# Patient Record
Sex: Female | Born: 1975 | Race: White | Hispanic: No | Marital: Single | Smoking: Current every day smoker
Health system: Southern US, Community
[De-identification: ages and names within clinical notes are randomized; demographics above are authoritative.]

## PROBLEM LIST (undated history)

## (undated) DIAGNOSIS — N151 Renal and perinephric abscess: Secondary | ICD-10-CM

## (undated) DIAGNOSIS — N200 Calculus of kidney: Secondary | ICD-10-CM

## (undated) DIAGNOSIS — G43909 Migraine, unspecified, not intractable, without status migrainosus: Secondary | ICD-10-CM

## (undated) HISTORY — PX: TUBAL LIGATION: SHX77

## (undated) HISTORY — PX: RENAL ARTERY STENT: SHX2321

## (undated) HISTORY — PX: APPENDECTOMY: SHX54

---

## 2014-06-17 ENCOUNTER — Emergency Department: Payer: Self-pay | Admitting: Emergency Medicine

## 2014-06-17 LAB — CBC WITH DIFFERENTIAL/PLATELET
BASOS ABS: 0.1 10*3/uL (ref 0.0–0.1)
Basophil %: 1.1 %
EOS ABS: 0.1 10*3/uL (ref 0.0–0.7)
EOS PCT: 0.4 %
HCT: 42.5 % (ref 35.0–47.0)
HGB: 13.6 g/dL (ref 12.0–16.0)
LYMPHS PCT: 13.2 %
Lymphocyte #: 1.7 10*3/uL (ref 1.0–3.6)
MCH: 28.2 pg (ref 26.0–34.0)
MCHC: 32 g/dL (ref 32.0–36.0)
MCV: 88 fL (ref 80–100)
Monocyte #: 0.4 x10 3/mm (ref 0.2–0.9)
Monocyte %: 3.5 %
NEUTROS ABS: 10.5 10*3/uL — AB (ref 1.4–6.5)
Neutrophil %: 81.8 %
Platelet: 359 10*3/uL (ref 150–440)
RBC: 4.84 10*6/uL (ref 3.80–5.20)
RDW: 15.8 % — AB (ref 11.5–14.5)
WBC: 12.8 10*3/uL — ABNORMAL HIGH (ref 3.6–11.0)

## 2014-06-17 LAB — COMPREHENSIVE METABOLIC PANEL
ALK PHOS: 95 U/L
AST: 22 U/L (ref 15–37)
Albumin: 3.8 g/dL (ref 3.4–5.0)
Anion Gap: 8 (ref 7–16)
BUN: 10 mg/dL (ref 7–18)
Bilirubin,Total: 0.4 mg/dL (ref 0.2–1.0)
CALCIUM: 8.9 mg/dL (ref 8.5–10.1)
CO2: 22 mmol/L (ref 21–32)
Chloride: 108 mmol/L — ABNORMAL HIGH (ref 98–107)
Creatinine: 0.82 mg/dL (ref 0.60–1.30)
EGFR (African American): >60
Glucose: 99 mg/dL (ref 65–99)
Osmolality: 275 (ref 275–301)
POTASSIUM: 4.1 mmol/L (ref 3.5–5.1)
SGPT (ALT): 15 U/L
Sodium: 138 mmol/L (ref 136–145)
Total Protein: 8.2 g/dL (ref 6.4–8.2)

## 2014-06-17 LAB — URINALYSIS, COMPLETE
BACTERIA: NONE SEEN
BILIRUBIN, UR: NEGATIVE
Blood: NEGATIVE
GLUCOSE, UR: NEGATIVE mg/dL (ref 0–75)
KETONE: NEGATIVE
Nitrite: NEGATIVE
PROTEIN: NEGATIVE
Ph: 6 (ref 4.5–8.0)
RBC,UR: 6 /HPF (ref 0–5)
SPECIFIC GRAVITY: 1.016 (ref 1.003–1.030)
WBC UR: 35 /HPF (ref 0–5)

## 2014-08-26 ENCOUNTER — Emergency Department: Payer: Self-pay | Admitting: Emergency Medicine

## 2014-08-26 LAB — BASIC METABOLIC PANEL
Anion Gap: 8 (ref 7–16)
BUN: 9 mg/dL (ref 7–18)
CALCIUM: 8.8 mg/dL (ref 8.5–10.1)
CHLORIDE: 103 mmol/L (ref 98–107)
CREATININE: 0.79 mg/dL (ref 0.60–1.30)
Co2: 27 mmol/L (ref 21–32)
GLUCOSE: 100 mg/dL — AB (ref 65–99)
OSMOLALITY: 274 (ref 275–301)
POTASSIUM: 3.7 mmol/L (ref 3.5–5.1)
SODIUM: 138 mmol/L (ref 136–145)

## 2014-08-26 LAB — TROPONIN I

## 2014-08-26 LAB — CBC
HCT: 45.3 % (ref 35.0–47.0)
HGB: 14.7 g/dL (ref 12.0–16.0)
MCH: 27.9 pg (ref 26.0–34.0)
MCHC: 32.4 g/dL (ref 32.0–36.0)
MCV: 86 fL (ref 80–100)
PLATELETS: 365 10*3/uL (ref 150–440)
RBC: 5.25 10*6/uL — AB (ref 3.80–5.20)
RDW: 16.7 % — ABNORMAL HIGH (ref 11.5–14.5)
WBC: 10 10*3/uL (ref 3.6–11.0)

## 2015-04-24 ENCOUNTER — Emergency Department
Admission: EM | Admit: 2015-04-24 | Discharge: 2015-04-24 | Disposition: A | Discharge status: Home / Self Care | Payer: Self-pay | Attending: Emergency Medicine | Admitting: Emergency Medicine

## 2015-04-24 ENCOUNTER — Emergency Department: Payer: Self-pay

## 2015-04-24 DIAGNOSIS — Z720 Tobacco use: Secondary | ICD-10-CM | POA: Insufficient documentation

## 2015-04-24 DIAGNOSIS — F419 Anxiety disorder, unspecified: Secondary | ICD-10-CM | POA: Insufficient documentation

## 2015-04-24 DIAGNOSIS — G43809 Other migraine, not intractable, without status migrainosus: Secondary | ICD-10-CM | POA: Insufficient documentation

## 2015-04-24 HISTORY — DX: Calculus of kidney: N20.0

## 2015-04-24 HISTORY — DX: Migraine, unspecified, not intractable, without status migrainosus: G43.909

## 2015-04-24 MED ORDER — METOCLOPRAMIDE HCL 5 MG/ML IJ SOLN
20.0000 mg | Freq: Once | INTRAVENOUS | Status: AC
  Administered 2015-04-24: 20 mg via INTRAVENOUS
  Filled 2015-04-24: qty 4

## 2015-04-24 MED ORDER — BUTALBITAL-APAP-CAFFEINE 50-325-40 MG PO TABS: 1.0000 | ORAL_TABLET | Freq: Four times a day (QID) | ORAL | Status: DC | PRN

## 2015-04-24 MED ORDER — KETOROLAC TROMETHAMINE 30 MG/ML IJ SOLN
30.0000 mg | Freq: Once | INTRAMUSCULAR | Status: AC
  Administered 2015-04-24: 30 mg via INTRAVENOUS
  Filled 2015-04-24: qty 1

## 2015-04-24 MED ORDER — SODIUM CHLORIDE 0.9 % IV SOLN
1000.0000 mL | Freq: Once | INTRAVENOUS | Status: AC
  Administered 2015-04-24: 1000 mL via INTRAVENOUS

## 2015-04-24 MED ORDER — DIPHENHYDRAMINE HCL 50 MG/ML IJ SOLN
25.0000 mg | Freq: Once | INTRAMUSCULAR | Status: AC
  Administered 2015-04-24: 25 mg via INTRAVENOUS
  Filled 2015-04-24: qty 1

## 2015-04-24 NOTE — ED Notes (Signed)
Presents with headache for couple of days

## 2015-04-24 NOTE — Discharge Instructions (Signed)

## 2015-04-24 NOTE — ED Provider Notes (Signed)
[facility] Emergency Department Provider Note  ____________________________________________  Time seen: On arrival  I have reviewed the triage vital signs and the nursing notes.   HISTORY  Chief Complaint Migraine    HPI Morgan Dean is a [age] y.o. female who presents with a headache which she describes as her typical migraine but somewhat worse. She reports it started 3 days ago she is having nausea and vomiting. She also complains of tightness in her neck but feels that it is related to tension and stress. She has no fevers. No sick contacts. No recent incarceration. She is moving all extremities well. No neuro deficits.     Past Medical History  Diagnosis Date  . Migraine   . Kidney stones     There are no active problems to display for this patient.   Past Surgical History  Procedure Laterality Date  . Tubal ligation    . Appendectomy    . Cesarean section    . Renal artery stent      No current outpatient prescriptions on file.  Allergies Review of patient's allergies indicates no known allergies.  No family history on file.  Social History History  Substance Use Topics  . Smoking status: Current Every Day Smoker -- 0.50 packs/day    Types: Cigarettes  . Smokeless tobacco: Never Used  . Alcohol Use: No    Review of Systems  Constitutional: Negative for fever. Eyes: Negative for visual changes. ENT: Negative for sore throat Cardiovascular: Negative for chest pain. Respiratory: Negative for shortness of breath. Gastrointestinal: Negative for abdominal pain, positive for nausea Genitourinary: Negative for dysuria. Musculoskeletal: Negative for back pain. Skin: Negative for rash. Neurological: Negative for focal weakness Psychiatric: Positive for anxiety  10-point ROS otherwise negative.  ____________________________________________   PHYSICAL EXAM:  VITAL SIGNS: ED Triage Vitals  Enc Vitals Group     BP 04/24/15  1323 127/91 mmHg     Pulse Rate 04/24/15 1323 95     Resp 04/24/15 1323 17     Temp 04/24/15 1323 98.6 F (37 C)     Temp Source 04/24/15 1323 Oral     SpO2 04/24/15 1323 99 %     Weight 04/24/15 1323 140 lb (63.504 kg)     Height 04/24/15 1323 5\' 7"  (1.702 m)     Head Cir --      Peak Flow --      Pain Score 04/24/15 1324 9     Pain Loc --      Pain Edu? --      Excl. in GC? --      Constitutional: Alert and oriented. Well appearing and in no distress. Eyes: Conjunctivae are normal. Normal funduscopic exam ENT   Head: Normocephalic and atraumatic. No meningismus   Mouth/Throat: Mucous membranes are moist. Cardiovascular: Normal rate, regular rhythm. Normal and symmetric distal pulses are present in all extremities. No murmurs, rubs, or gallops. Respiratory: Normal respiratory effort without tachypnea nor retractions. Breath sounds are clear and equal bilaterally.  Gastrointestinal: Soft and non-tender in all quadrants. No distention. There is no CVA tenderness. Genitourinary: deferred Musculoskeletal: Nontender with normal range of motion in all extremities. No lower extremity tenderness nor edema. Neurologic:  Normal speech and language. No gross focal neurologic deficits are appreciated. Normal strength and sensation Skin:  Skin is warm, dry and intact. No rash noted. Psychiatric: Mood and affect are normal. Patient exhibits appropriate insight and judgment.  ____________________________________________    LABS (pertinent positives/negatives)  Labs Reviewed - No data to display  ____________________________________________   EKG  None  ____________________________________________    RADIOLOGY I have personally reviewed any xrays that were ordered on this patient: CT head unremarkable  ____________________________________________   PROCEDURES  Procedure(s) performed: none  Critical Care performed:  none  ____________________________________________   INITIAL IMPRESSION / ASSESSMENT AND PLAN / ED COURSE  Pertinent labs & imaging results that were available during my care of the patient were reviewed by me and considered in my medical decision making (see chart for details).  Patient with symptoms consistent with her migraine syndrome we will do Reglan and Benadryl IV and normal saline. Patient is afebrile, on exam has no meningismus  ----------------------------------------- 2:20 PM on 04/24/2015 -----------------------------------------  Patient reports Reglan and Benadryl don't work for her and is requesting something stronger. I noted that we are using a higher dose of Reglan that she may have had the past which should help ----------------------------------------- 3:55 PM on 04/24/2015 -----------------------------------------  Patient reports significant improvement in headache. She is anxious to go home and rest. We will discharge with prescription for Fioricet. Return precautions discussed ____________________________________________   FINAL CLINICAL IMPRESSION(S) / ED DIAGNOSES  Final diagnoses:  Other migraine without status migrainosus, not intractable     Jene Every, MD 04/24/15 1556

## 2015-04-24 NOTE — ED Notes (Signed)
Pt c/o HA for the past 3 days with N/V, dizziness..states she has a hx of migraines but never this bad or this length of time..states she has been taking OTC meds without relief

## 2015-04-24 NOTE — ED Notes (Addendum)
Worst headache ever , nausea , neck stiffness, photophobia

## 2015-05-12 ENCOUNTER — Encounter: Payer: Self-pay | Admitting: Emergency Medicine

## 2015-05-12 ENCOUNTER — Emergency Department
Admission: EM | Admit: 2015-05-12 | Discharge: 2015-05-12 | Disposition: A | Discharge status: Home / Self Care | Payer: Self-pay | Attending: Emergency Medicine | Admitting: Emergency Medicine

## 2015-05-12 DIAGNOSIS — N390 Urinary tract infection, site not specified: Secondary | ICD-10-CM | POA: Insufficient documentation

## 2015-05-12 DIAGNOSIS — R109 Unspecified abdominal pain: Secondary | ICD-10-CM

## 2015-05-12 DIAGNOSIS — Z3202 Encounter for pregnancy test, result negative: Secondary | ICD-10-CM | POA: Insufficient documentation

## 2015-05-12 DIAGNOSIS — R202 Paresthesia of skin: Secondary | ICD-10-CM | POA: Insufficient documentation

## 2015-05-12 DIAGNOSIS — Z720 Tobacco use: Secondary | ICD-10-CM | POA: Insufficient documentation

## 2015-05-12 DIAGNOSIS — Z79899 Other long term (current) drug therapy: Secondary | ICD-10-CM | POA: Insufficient documentation

## 2015-05-12 LAB — URINALYSIS COMPLETE WITH MICROSCOPIC (ARMC ONLY)
Bilirubin Urine: NEGATIVE
Glucose, UA: NEGATIVE mg/dL
Ketones, ur: NEGATIVE mg/dL
Nitrite: NEGATIVE
PH: 5 (ref 5.0–8.0)
PROTEIN: 100 mg/dL — AB
Specific Gravity, Urine: 1.025 (ref 1.005–1.030)

## 2015-05-12 LAB — POCT PREGNANCY, URINE: Preg Test, Ur: NEGATIVE

## 2015-05-12 MED ORDER — ORPHENADRINE CITRATE 30 MG/ML IJ SOLN
60.0000 mg | INTRAMUSCULAR | Status: AC
  Administered 2015-05-12: 60 mg via INTRAMUSCULAR
  Filled 2015-05-12: qty 2

## 2015-05-12 MED ORDER — KETOROLAC TROMETHAMINE 60 MG/2ML IM SOLN
60.0000 mg | Freq: Once | INTRAMUSCULAR | Status: AC
  Administered 2015-05-12: 60 mg via INTRAMUSCULAR
  Filled 2015-05-12: qty 2

## 2015-05-12 MED ORDER — CEPHALEXIN 500 MG PO CAPS: 500.0000 mg | ORAL_CAPSULE | Freq: Two times a day (BID) | ORAL | Status: AC

## 2015-05-12 MED ORDER — KETOROLAC TROMETHAMINE 10 MG PO TABS: 10.0000 mg | ORAL_TABLET | Freq: Three times a day (TID) | ORAL | Status: DC

## 2015-05-12 MED ORDER — TRAMADOL HCL 50 MG PO TABS: 50.0000 mg | ORAL_TABLET | Freq: Three times a day (TID) | ORAL | Status: DC

## 2015-05-12 MED ORDER — CEPHALEXIN 500 MG PO CAPS
500.0000 mg | ORAL_CAPSULE | Freq: Two times a day (BID) | ORAL | Status: DC
  Administered 2015-05-12: 500 mg via ORAL
  Filled 2015-05-12: qty 1

## 2015-05-12 MED ORDER — HYDROCODONE-ACETAMINOPHEN 5-325 MG PO TABS: 1.0000 | ORAL_TABLET | Freq: Four times a day (QID) | ORAL | Status: DC | PRN

## 2015-05-12 NOTE — Discharge Instructions (Signed)
Urinary Tract Infection Urinary tract infections (UTIs) can develop anywhere along your urinary tract. Your urinary tract is your body's drainage system for removing wastes and extra water. Your urinary tract includes two kidneys, two ureters, a bladder, and a urethra. Your kidneys are a pair of bean-shaped organs. Each kidney is about the size of your fist. They are located below your ribs, one on each side of your spine. CAUSES Infections are caused by microbes, which are microscopic organisms, including fungi, viruses, and bacteria. These organisms are so small that they can only be seen through a microscope. Bacteria are the microbes that most commonly cause UTIs. SYMPTOMS  Symptoms of UTIs may vary by age and gender of the patient and by the location of the infection. Symptoms in young women typically include a frequent and intense urge to urinate and a painful, burning feeling in the bladder or urethra during urination. Older women and men are more likely to be tired, shaky, and weak and have muscle aches and abdominal pain. A fever may mean the infection is in your kidneys. Other symptoms of a kidney infection include pain in your back or sides below the ribs, nausea, and vomiting. DIAGNOSIS To diagnose a UTI, your caregiver will ask you about your symptoms. Your caregiver also will ask to provide a urine sample. The urine sample will be tested for bacteria and white blood cells. White blood cells are made by your body to help fight infection. TREATMENT  Typically, UTIs can be treated with medication. Because most UTIs are caused by a bacterial infection, they usually can be treated with the use of antibiotics. The choice of antibiotic and length of treatment depend on your symptoms and the type of bacteria causing your infection. HOME CARE INSTRUCTIONS  If you were prescribed antibiotics, take them exactly as your caregiver instructs you. Finish the medication even if you feel better after you  have only taken some of the medication.  Drink enough water and fluids to keep your urine clear or pale yellow.  Avoid caffeine, tea, and carbonated beverages. They tend to irritate your bladder.  Empty your bladder often. Avoid holding urine for long periods of time.  Empty your bladder before and after sexual intercourse.  After a bowel movement, women should cleanse from front to back. Use each tissue only once. SEEK MEDICAL CARE IF:   You have back pain.  You develop a fever.  Your symptoms do not begin to resolve within 3 days. SEEK IMMEDIATE MEDICAL CARE IF:   You have severe back pain or lower abdominal pain.  You develop chills.  You have nausea or vomiting.  You have continued burning or discomfort with urination. MAKE SURE YOU:   Understand these instructions.  Will watch your condition.  Will get help right away if you are not doing well or get worse. Document Released: 07/03/2005 Document Revised: 03/24/2012 Document Reviewed: 11/01/2011 [facility] Patient Information 2015 [place], [place]. This information is not intended to replace advice given to you by your health care provider. Make sure you discuss any questions you have with your health care provider.  Take the antibiotic as directed until completely gone.  Take the pain medicine and anti-inflammatory as directed.  Increase fluid intake and take regular bathroom breaks.  Follow-up with [employer] or the [facility] Department for further care. Return as needed.

## 2015-05-12 NOTE — ED Notes (Signed)
C/o mid to low back and pain right low back also painful urination, also some tingling in legs

## 2015-05-12 NOTE — ED Notes (Signed)
Pt presents with low back pain for four days. Pt with hx of UTI.

## 2015-05-12 NOTE — ED Provider Notes (Signed)
[facility] Emergency Department Provider Note ____________________________________________  Time seen: 1120  I have reviewed the triage vital signs and the nursing notes.  HISTORY  Chief Complaint  Back Pain  HPI Morgan Dean is a 39 y.o. female reports to the ED with complaints of low back pain for the last 4 days. She also notes some dysuria over the last couple days. She has a history of recurrent UTIs. She notes that this morning she had some tingling to the anterior thighs, but denies any known trauma, injury, slip, trip, fall. She denies any weakness in the lower extremities or any incontinence of bladder or bowel. She has dosed Tylenol and Motrin without significant relief to her symptoms. She gives a history of kidney stones denies any gross hematuria or any visible stones with urination. She also denies any nausea, vomiting, fever, chills, or sweats.  Past Medical History  Diagnosis Date  . Migraine   . Kidney stones    There are no active problems to display for this patient.  Past Surgical History  Procedure Laterality Date  . Tubal ligation    . Appendectomy    . Cesarean section    . Renal artery stent      Current Outpatient Rx  Name  Route  Sig  Dispense  Refill  . butalbital-acetaminophen-caffeine (FIORICET) 50-325-40 MG per tablet   Oral   Take 1-2 tablets by mouth every 6 (six) hours as needed for headache.   20 tablet   0   . cephALEXin (KEFLEX) 500 MG capsule   Oral   Take 1 capsule (500 mg total) by mouth 2 (two) times daily.   14 capsule   0   . ketorolac (TORADOL) 10 MG tablet   Oral   Take 1 tablet (10 mg total) by mouth every 8 (eight) hours.   15 tablet   0   . traMADol (ULTRAM) 50 MG tablet   Oral   Take 1 tablet (50 mg total) by mouth 3 (three) times daily.   12 tablet   0    Allergies Review of patient's allergies indicates no known allergies.  No family history on file.  Social History History   Substance Use Topics  . Smoking status: Current Every Day Smoker -- 0.50 packs/day    Types: Cigarettes  . Smokeless tobacco: Never Used  . Alcohol Use: No   Review of Systems  Constitutional: Negative for fever. Eyes: Negative for visual changes. ENT: Negative for sore throat. Cardiovascular: Negative for chest pain. Respiratory: Negative for shortness of breath. Gastrointestinal: Negative for abdominal pain, vomiting and diarrhea. Genitourinary: Positive for dysuria. Musculoskeletal: Positive for back pain. Skin: Negative for rash. Neurological: Negative for headaches, focal weakness or numbness. ____________________________________________  PHYSICAL EXAM:  VITAL SIGNS: ED Triage Vitals  Enc Vitals Group     BP 05/12/15 1057 133/93 mmHg     Pulse Rate 05/12/15 1057 98     Resp 05/12/15 1057 20     Temp 05/12/15 1057 98 F (36.7 C)     Temp Source 05/12/15 1057 Oral     SpO2 05/12/15 1057 100 %     Weight 05/12/15 1057 140 lb (63.504 kg)     Height 05/12/15 1057 5\' 7"  (1.702 m)     Head Cir --      Peak Flow --      Pain Score 05/12/15 1057 10     Pain Loc --      Pain Edu? --  Excl. in GC? --    Constitutional: Alert and oriented. Well appearing and in no distress. Eyes: Conjunctivae are normal. PERRL. Normal extraocular movements. ENT   Head: Normocephalic and atraumatic.   Nose: No congestion/rhinnorhea.   Mouth/Throat: Mucous membranes are moist.   Neck: Supple. No thyromegaly. Hematological/Lymphatic/Immunilogical: No cervical lymphadenopathy. Cardiovascular: Normal rate, regular rhythm.  Respiratory: Normal respiratory effort. No wheezes/rales/rhonchi. Gastrointestinal: Soft and nontender. No distention. Right CVA tenderness Musculoskeletal: Nontender with normal range of motion in all extremities. Normal spinal alignment without spasm, step-off or deformity. Normal lumbar flexion to the toes. Normal toe-heel raise.  Neurologic:  Normal  gait without ataxia. Normal speech and language. No gross focal neurologic deficits are appreciated. Normal LE DTRs bilaterally. Negative SLR bilaterally.  Skin:  Skin is warm, dry and intact. No rash noted. Psychiatric: Mood and affect are normal. Patient exhibits appropriate insight and judgment. ____________________________________________    LABS (pertinent positives/negatives) Labs Reviewed  URINALYSIS COMPLETEWITH MICROSCOPIC ([facility] ONLY) - Abnormal; Notable for the following:    Color, Urine YELLOW (*)    APPearance CLOUDY (*)    Hgb urine dipstick 3+ (*)    Protein, ur 100 (*)    Leukocytes, UA 3+ (*)    Bacteria, UA FEW (*)    Squamous Epithelial / LPF 0-5 (*)    All other components within normal limits  URINE CULTURE  POC URINE PREG, ED  POCT PREGNANCY, URINE  ____________________________________________  PROCEDURES  Toradol 60 mg IM Norflex 60 mg IM Keflex 500 mg PO ____________________________________________  INITIAL IMPRESSION / ASSESSMENT AND PLAN / ED COURSE  Treatment for acute UTI and right flank pain. Prescription Keflex #14, Ultram #12, and Toradol #15 provided.  Follow-up with [employer] as needed. Return as discussed for increased flank pain, hematuria, or dysuria.   ----------------------------------------- 1:16 PM on 05/12/2015 ----------------------------------------- Patient reports to RN at discharge that Ultram caused vomiting.  Will prescribe Norco #10 as alternate.  ____________________________________________  FINAL CLINICAL IMPRESSION(S) / ED DIAGNOSES  Final diagnoses:  UTI (lower urinary tract infection)  Acute flank pain     Lissa Hoard, PA-C 05/12/15 [address] [place], [place] 05/12/15 1317

## 2015-05-14 LAB — URINE CULTURE: SPECIAL REQUESTS: NORMAL

## 2015-05-17 ENCOUNTER — Emergency Department: Payer: BLUE CROSS/BLUE SHIELD

## 2015-05-17 ENCOUNTER — Encounter: Payer: Self-pay | Admitting: *Deleted

## 2015-05-17 ENCOUNTER — Inpatient Hospital Stay: Payer: BLUE CROSS/BLUE SHIELD | Admitting: Anesthesiology

## 2015-05-17 ENCOUNTER — Encounter
Admission: EM | Disposition: A | Discharge status: Home / Self Care | Payer: Self-pay | Source: Home / Self Care | Attending: Internal Medicine

## 2015-05-17 ENCOUNTER — Inpatient Hospital Stay
Admission: EM | Admit: 2015-05-17 | Discharge: 2015-05-20 | DRG: 853 | Disposition: A | Discharge status: Home / Self Care | Payer: BLUE CROSS/BLUE SHIELD | Attending: Internal Medicine | Admitting: Internal Medicine

## 2015-05-17 DIAGNOSIS — Z888 Allergy status to other drugs, medicaments and biological substances status: Secondary | ICD-10-CM | POA: Diagnosis not present

## 2015-05-17 DIAGNOSIS — F419 Anxiety disorder, unspecified: Secondary | ICD-10-CM | POA: Diagnosis present

## 2015-05-17 DIAGNOSIS — Z9851 Tubal ligation status: Secondary | ICD-10-CM | POA: Diagnosis not present

## 2015-05-17 DIAGNOSIS — Z9049 Acquired absence of other specified parts of digestive tract: Secondary | ICD-10-CM | POA: Diagnosis present

## 2015-05-17 DIAGNOSIS — A4151 Sepsis due to Escherichia coli [E. coli]: Principal | ICD-10-CM

## 2015-05-17 DIAGNOSIS — Z87442 Personal history of urinary calculi: Secondary | ICD-10-CM | POA: Diagnosis not present

## 2015-05-17 DIAGNOSIS — E876 Hypokalemia: Secondary | ICD-10-CM | POA: Diagnosis present

## 2015-05-17 DIAGNOSIS — Z79891 Long term (current) use of opiate analgesic: Secondary | ICD-10-CM | POA: Diagnosis not present

## 2015-05-17 DIAGNOSIS — Z79899 Other long term (current) drug therapy: Secondary | ICD-10-CM

## 2015-05-17 DIAGNOSIS — F17210 Nicotine dependence, cigarettes, uncomplicated: Secondary | ICD-10-CM | POA: Diagnosis present

## 2015-05-17 DIAGNOSIS — N390 Urinary tract infection, site not specified: Secondary | ICD-10-CM

## 2015-05-17 DIAGNOSIS — N151 Renal and perinephric abscess: Secondary | ICD-10-CM | POA: Diagnosis present

## 2015-05-17 DIAGNOSIS — R509 Fever, unspecified: Secondary | ICD-10-CM

## 2015-05-17 DIAGNOSIS — N134 Hydroureter: Secondary | ICD-10-CM

## 2015-05-17 DIAGNOSIS — N201 Calculus of ureter: Secondary | ICD-10-CM | POA: Diagnosis present

## 2015-05-17 DIAGNOSIS — N132 Hydronephrosis with renal and ureteral calculous obstruction: Secondary | ICD-10-CM | POA: Diagnosis present

## 2015-05-17 DIAGNOSIS — L0291 Cutaneous abscess, unspecified: Secondary | ICD-10-CM

## 2015-05-17 DIAGNOSIS — N1330 Unspecified hydronephrosis: Secondary | ICD-10-CM

## 2015-05-17 DIAGNOSIS — Z9889 Other specified postprocedural states: Secondary | ICD-10-CM

## 2015-05-17 HISTORY — PX: CYSTOSCOPY WITH RETROGRADE PYELOGRAM, URETEROSCOPY AND STENT PLACEMENT: SHX5789

## 2015-05-17 HISTORY — PX: URETEROSCOPY WITH HOLMIUM LASER LITHOTRIPSY: SHX6645

## 2015-05-17 LAB — CBC WITH DIFFERENTIAL/PLATELET
BASOS PCT: 1 %
Basophils Absolute: 0.1 10*3/uL (ref 0–0.1)
EOS PCT: 1 %
Eosinophils Absolute: 0.1 10*3/uL (ref 0–0.7)
HEMATOCRIT: 32.4 % — AB (ref 35.0–47.0)
Hemoglobin: 10.7 g/dL — ABNORMAL LOW (ref 12.0–16.0)
Lymphocytes Relative: 14 %
Lymphs Abs: 1.7 10*3/uL (ref 1.0–3.6)
MCH: 26.9 pg (ref 26.0–34.0)
MCHC: 33 g/dL (ref 32.0–36.0)
MCV: 81.6 fL (ref 80.0–100.0)
MONO ABS: 1 10*3/uL — AB (ref 0.2–0.9)
MONOS PCT: 8 %
NEUTROS PCT: 76 %
Neutro Abs: 9.4 10*3/uL — ABNORMAL HIGH (ref 1.4–6.5)
Platelets: 318 10*3/uL (ref 150–440)
RBC: 3.97 MIL/uL (ref 3.80–5.20)
RDW: 14.5 % (ref 11.5–14.5)
WBC: 12.2 10*3/uL — ABNORMAL HIGH (ref 3.6–11.0)

## 2015-05-17 LAB — COMPREHENSIVE METABOLIC PANEL
ALK PHOS: 115 U/L (ref 38–126)
ALT: 20 U/L (ref 14–54)
ANION GAP: 10 (ref 5–15)
AST: 15 U/L (ref 15–41)
Albumin: 3.6 g/dL (ref 3.5–5.0)
BILIRUBIN TOTAL: 0.2 mg/dL — AB (ref 0.3–1.2)
BUN: 9 mg/dL (ref 6–20)
CHLORIDE: 96 mmol/L — AB (ref 101–111)
CO2: 27 mmol/L (ref 22–32)
Calcium: 8.8 mg/dL — ABNORMAL LOW (ref 8.9–10.3)
Creatinine, Ser: 0.78 mg/dL (ref 0.44–1.00)
GFR calc Af Amer: >60 mL/min (ref >60)
GLUCOSE: 116 mg/dL — AB (ref 65–99)
Potassium: 3.3 mmol/L — ABNORMAL LOW (ref 3.5–5.1)
Sodium: 133 mmol/L — ABNORMAL LOW (ref 135–145)
TOTAL PROTEIN: 7.8 g/dL (ref 6.5–8.1)

## 2015-05-17 LAB — URINALYSIS COMPLETE WITH MICROSCOPIC (ARMC ONLY)
Bacteria, UA: NONE SEEN
Bilirubin Urine: NEGATIVE
Glucose, UA: NEGATIVE mg/dL
KETONES UR: NEGATIVE mg/dL
NITRITE: NEGATIVE
PROTEIN: 30 mg/dL — AB
SQUAMOUS EPITHELIAL / LPF: NONE SEEN
Specific Gravity, Urine: 1.023 (ref 1.005–1.030)
pH: 5 (ref 5.0–8.0)

## 2015-05-17 LAB — CREATININE, SERUM
Creatinine, Ser: 0.67 mg/dL (ref 0.44–1.00)
GFR calc Af Amer: >60 mL/min (ref >60)
GFR calc non Af Amer: >60 mL/min (ref >60)

## 2015-05-17 LAB — CBC
HCT: 30.8 % — ABNORMAL LOW (ref 35.0–47.0)
Hemoglobin: 10.2 g/dL — ABNORMAL LOW (ref 12.0–16.0)
MCH: 27.1 pg (ref 26.0–34.0)
MCHC: 33.2 g/dL (ref 32.0–36.0)
MCV: 81.6 fL (ref 80.0–100.0)
Platelets: 292 10*3/uL (ref 150–440)
RBC: 3.78 MIL/uL — ABNORMAL LOW (ref 3.80–5.20)
RDW: 14.4 % (ref 11.5–14.5)
WBC: 10.5 10*3/uL (ref 3.6–11.0)

## 2015-05-17 LAB — LIPASE, BLOOD: Lipase: 13 U/L — ABNORMAL LOW (ref 22–51)

## 2015-05-17 LAB — LACTIC ACID, PLASMA
Lactic Acid, Venous: 1.4 mmol/L (ref 0.5–2.0)
Lactic Acid, Venous: 1.3 mmol/L (ref 0.5–2.0)

## 2015-05-17 LAB — POCT PREGNANCY, URINE: PREG TEST UR: NEGATIVE

## 2015-05-17 SURGERY — CYSTOURETEROSCOPY, WITH RETROGRADE PYELOGRAM AND STENT INSERTION
Anesthesia: General | Site: Ureter | Laterality: Right | Wound class: Clean Contaminated

## 2015-05-17 MED ORDER — MIDAZOLAM HCL 2 MG/2ML IJ SOLN
INTRAMUSCULAR | Status: DC | PRN
  Administered 2015-05-17: 1 mg via INTRAVENOUS

## 2015-05-17 MED ORDER — ONDANSETRON HCL 4 MG/2ML IJ SOLN
4.0000 mg | INTRAMUSCULAR | Status: AC
  Administered 2015-05-17: 4 mg via INTRAVENOUS
  Filled 2015-05-17: qty 2

## 2015-05-17 MED ORDER — SODIUM CHLORIDE 0.9 % IJ SOLN
3.0000 mL | Freq: Two times a day (BID) | INTRAMUSCULAR | Status: DC
  Administered 2015-05-17 – 2015-05-18 (×2): 3 mL via INTRAVENOUS

## 2015-05-17 MED ORDER — FENTANYL CITRATE (PF) 100 MCG/2ML IJ SOLN
INTRAMUSCULAR | Status: DC | PRN
  Administered 2015-05-17: 50 ug via INTRAVENOUS

## 2015-05-17 MED ORDER — IOHEXOL 240 MG/ML SOLN
25.0000 mL | Freq: Once | INTRAMUSCULAR | Status: AC | PRN
  Administered 2015-05-17: 25 mL via ORAL

## 2015-05-17 MED ORDER — CIPROFLOXACIN IN D5W 400 MG/200ML IV SOLN
400.0000 mg | INTRAVENOUS | Status: AC
  Administered 2015-05-17: 400 mg via INTRAVENOUS
  Filled 2015-05-17: qty 200

## 2015-05-17 MED ORDER — DEXTROSE 5 % IV SOLN
1.0000 g | INTRAVENOUS | Status: AC
  Administered 2015-05-17: 1 g via INTRAVENOUS

## 2015-05-17 MED ORDER — PHENYLEPHRINE HCL 10 MG/ML IJ SOLN
INTRAMUSCULAR | Status: DC | PRN
Start: 2015-05-17 — End: 2015-05-17
  Administered 2015-05-17 (×10): 100 ug via INTRAVENOUS

## 2015-05-17 MED ORDER — CEFTRIAXONE SODIUM 1 G IJ SOLR
INTRAMUSCULAR | Status: AC
  Filled 2015-05-17: qty 10

## 2015-05-17 MED ORDER — HYDROMORPHONE HCL 1 MG/ML IJ SOLN
INTRAMUSCULAR | Status: AC
Start: 2015-05-17 — End: 2015-05-17
  Administered 2015-05-17: 1 mg via INTRAVENOUS
  Filled 2015-05-17: qty 1

## 2015-05-17 MED ORDER — SODIUM CHLORIDE 0.9 % IV BOLUS (SEPSIS)
1000.0000 mL | INTRAVENOUS | Status: AC
  Administered 2015-05-17: 1000 mL via INTRAVENOUS

## 2015-05-17 MED ORDER — DEXTROSE 5 % IV SOLN
1.0000 g | INTRAVENOUS | Status: DC
  Filled 2015-05-17: qty 10

## 2015-05-17 MED ORDER — ACETAMINOPHEN 325 MG PO TABS
650.0000 mg | ORAL_TABLET | Freq: Four times a day (QID) | ORAL | Status: DC | PRN
Start: 2015-05-17 — End: 2015-05-20
  Administered 2015-05-18: 650 mg via ORAL
  Filled 2015-05-17: qty 2

## 2015-05-17 MED ORDER — SODIUM CHLORIDE 0.9 % IV SOLN
INTRAVENOUS | Status: DC
  Administered 2015-05-17 – 2015-05-20 (×7): via INTRAVENOUS

## 2015-05-17 MED ORDER — NICOTINE 21 MG/24HR TD PT24
21.0000 mg | MEDICATED_PATCH | Freq: Every day | TRANSDERMAL | Status: DC
  Administered 2015-05-17 – 2015-05-20 (×4): 21 mg via TRANSDERMAL
  Filled 2015-05-17 (×4): qty 1

## 2015-05-17 MED ORDER — HYDROMORPHONE HCL 1 MG/ML IJ SOLN
1.0000 mg | INTRAMUSCULAR | Status: AC
  Administered 2015-05-17: 1 mg via INTRAVENOUS

## 2015-05-17 MED ORDER — POTASSIUM CHLORIDE 20 MEQ/15ML (10%) PO SOLN
40.0000 meq | Freq: Once | ORAL | Status: AC
  Administered 2015-05-18: 40 meq via ORAL
  Filled 2015-05-17 (×2): qty 30

## 2015-05-17 MED ORDER — POTASSIUM CHLORIDE CRYS ER 20 MEQ PO TBCR
EXTENDED_RELEASE_TABLET | ORAL | Status: AC
  Filled 2015-05-17: qty 2

## 2015-05-17 MED ORDER — SENNOSIDES-DOCUSATE SODIUM 8.6-50 MG PO TABS: 1.0000 | ORAL_TABLET | Freq: Every evening | ORAL | Status: DC | PRN

## 2015-05-17 MED ORDER — BISACODYL 5 MG PO TBEC: 5.0000 mg | DELAYED_RELEASE_TABLET | Freq: Every day | ORAL | Status: DC | PRN

## 2015-05-17 MED ORDER — ALUM & MAG HYDROXIDE-SIMETH 200-200-20 MG/5ML PO SUSP: 30.0000 mL | Freq: Four times a day (QID) | ORAL | Status: DC | PRN

## 2015-05-17 MED ORDER — ACETAMINOPHEN 650 MG RE SUPP: 650.0000 mg | Freq: Four times a day (QID) | RECTAL | Status: DC | PRN

## 2015-05-17 MED ORDER — CIPROFLOXACIN IN D5W 400 MG/200ML IV SOLN
400.0000 mg | Freq: Once | INTRAVENOUS | Status: DC
  Filled 2015-05-17: qty 200

## 2015-05-17 MED ORDER — CEFTRIAXONE SODIUM 1 G IJ SOLR
1.0000 g | INTRAMUSCULAR | Status: AC
  Administered 2015-05-17: 1 g via INTRAVENOUS
  Filled 2015-05-17 (×2): qty 10

## 2015-05-17 MED ORDER — IOHEXOL 300 MG/ML  SOLN
100.0000 mL | Freq: Once | INTRAMUSCULAR | Status: AC | PRN
  Administered 2015-05-17: 100 mL via INTRAVENOUS

## 2015-05-17 MED ORDER — ONDANSETRON HCL 4 MG/2ML IJ SOLN: 4.0000 mg | Freq: Four times a day (QID) | INTRAMUSCULAR | Status: DC | PRN

## 2015-05-17 MED ORDER — FENTANYL CITRATE (PF) 100 MCG/2ML IJ SOLN
25.0000 ug | INTRAMUSCULAR | Status: AC | PRN
  Administered 2015-05-17 (×6): 25 ug via INTRAVENOUS

## 2015-05-17 MED ORDER — MORPHINE SULFATE 4 MG/ML IJ SOLN
4.0000 mg | Freq: Once | INTRAMUSCULAR | Status: AC
  Administered 2015-05-17: 4 mg via INTRAVENOUS
  Filled 2015-05-17: qty 1

## 2015-05-17 MED ORDER — LIDOCAINE HCL (CARDIAC) 20 MG/ML IV SOLN
INTRAVENOUS | Status: DC | PRN
  Administered 2015-05-17: 80 mg via INTRAVENOUS

## 2015-05-17 MED ORDER — PROPOFOL 10 MG/ML IV BOLUS
INTRAVENOUS | Status: DC | PRN
  Administered 2015-05-17: 150 mg via INTRAVENOUS

## 2015-05-17 MED ORDER — PROMETHAZINE HCL 25 MG/ML IJ SOLN: 6.2500 mg | INTRAMUSCULAR | Status: DC | PRN

## 2015-05-17 MED ORDER — HYDROMORPHONE HCL 1 MG/ML IJ SOLN
1.0000 mg | INTRAMUSCULAR | Status: DC | PRN
  Administered 2015-05-18 – 2015-05-19 (×12): 1 mg via INTRAVENOUS
  Filled 2015-05-17 (×12): qty 1

## 2015-05-17 MED ORDER — HEPARIN SODIUM (PORCINE) 5000 UNIT/ML IJ SOLN
5000.0000 [IU] | Freq: Three times a day (TID) | INTRAMUSCULAR | Status: DC
  Administered 2015-05-17 – 2015-05-20 (×7): 5000 [IU] via SUBCUTANEOUS
  Filled 2015-05-17 (×7): qty 1

## 2015-05-17 MED ORDER — HYDROCODONE-ACETAMINOPHEN 5-325 MG PO TABS
1.0000 | ORAL_TABLET | ORAL | Status: DC | PRN
  Administered 2015-05-17: 2 via ORAL
  Administered 2015-05-18: 1 via ORAL
  Administered 2015-05-18 – 2015-05-19 (×2): 2 via ORAL
  Filled 2015-05-17: qty 2
  Filled 2015-05-17: qty 1
  Filled 2015-05-17 (×2): qty 2

## 2015-05-17 MED ORDER — SODIUM CHLORIDE 0.9 % IR SOLN
Status: DC | PRN
  Administered 2015-05-17: 3000 mL

## 2015-05-17 MED ORDER — SUCCINYLCHOLINE CHLORIDE 20 MG/ML IJ SOLN
INTRAMUSCULAR | Status: DC | PRN
  Administered 2015-05-17: 100 mg via INTRAVENOUS

## 2015-05-17 MED ORDER — ONDANSETRON HCL 4 MG PO TABS: 4.0000 mg | ORAL_TABLET | Freq: Four times a day (QID) | ORAL | Status: DC | PRN

## 2015-05-17 MED ORDER — DEXTROSE 5 % IV SOLN
INTRAVENOUS | Status: AC
  Administered 2015-05-17: 1 g via INTRAVENOUS
  Filled 2015-05-17: qty 10

## 2015-05-17 MED ORDER — SODIUM CHLORIDE 0.9 % IV SOLN
INTRAVENOUS | Status: DC | PRN
  Administered 2015-05-17: 9 mL

## 2015-05-17 MED ORDER — LACTATED RINGERS IV SOLN
INTRAVENOUS | Status: DC | PRN
  Administered 2015-05-17: 22:00:00 via INTRAVENOUS

## 2015-05-17 SURGICAL SUPPLY — 36 items
ADAPTER CATH URET PLST 4-6FR (CATHETERS) IMPLANT
BAG DRAIN CYSTO-URO LG1000N (MISCELLANEOUS) ×4 IMPLANT
BASKET LASER NITINOL 1.9FR (BASKET) ×4 IMPLANT
BASKET STNLS GEMINI 4WIRE 3FR (BASKET) IMPLANT
BASKET ZERO TIP NITINOL 2.4FR (BASKET) IMPLANT
CATH FOLEY 2WAY  5CC 16FR (CATHETERS) ×2
CATH INTERMIT  6FR 70CM (CATHETERS) IMPLANT
CATH URET 5FR 28IN CONE TIP (BALLOONS)
CATH URET 5FR 28IN OPEN ENDED (CATHETERS) IMPLANT
CATH URET 5FR 70CM CONE TIP (BALLOONS) IMPLANT
CATH URET DUAL LUMEN 6-10FR 50 (CATHETERS) IMPLANT
CATH URTH 16FR FL 2W BLN LF (CATHETERS) ×2 IMPLANT
ELECT REM PT RETURN 9FT ADLT (ELECTROSURGICAL)
ELECTRODE REM PT RTRN 9FT ADLT (ELECTROSURGICAL) IMPLANT
FIBER LASER FLEXIVA 365 (UROLOGICAL SUPPLIES) IMPLANT
FIBER LASER TRAC TIP (UROLOGICAL SUPPLIES) IMPLANT
GLOVE BIO SURGEON STRL SZ7.5 (GLOVE) ×4 IMPLANT
GOWN STRL REUS W/ TWL LRG LVL3 (GOWN DISPOSABLE) ×2 IMPLANT
GOWN STRL REUS W/TWL LRG LVL3 (GOWN DISPOSABLE) ×2
GUIDEWIRE 0.038 PTFE COATED (WIRE) ×4 IMPLANT
GUIDEWIRE ANG ZIPWIRE 038X150 (WIRE) IMPLANT
GUIDEWIRE STR DUAL SENSOR (WIRE) IMPLANT
IV NS IRRIG 3000ML ARTHROMATIC (IV SOLUTION) ×8 IMPLANT
KIT BALLIN UROMAX 15FX10 (LABEL) IMPLANT
KIT BALLN UROMAX 15FX4 (MISCELLANEOUS) IMPLANT
KIT BALLN UROMAX 26 75X4 (MISCELLANEOUS)
LASER HOLMIUM SU 200UM (MISCELLANEOUS) ×4 IMPLANT
LASER HOLMIUM SU 940UM (MISCELLANEOUS) ×4 IMPLANT
PACK CYSTO (CUSTOM PROCEDURE TRAY) ×4 IMPLANT
PAD OB MATERNITY 4.3X12.25 (PERSONAL CARE ITEMS) ×4 IMPLANT
SENSORWIRE 0.038 NOT ANGLED (WIRE) ×4
SET HIGH PRES BAL DIL (LABEL)
SHEATH ACCESS URETERAL 38CM (SHEATH) IMPLANT
STENT URET 6FRX26 CONTOUR (STENTS) ×4 IMPLANT
SYRINGE IRR TOOMEY STRL 70CC (SYRINGE) IMPLANT
WIRE SENSOR 0.038 NOT ANGLED (WIRE) ×2 IMPLANT

## 2015-05-17 NOTE — Consult Note (Signed)
Consult: Right ureteral stone, right hydronephrosis, sepsis, right renal abscess Requested by: Dr. York Cerise; Dr. Juliene Pina  History of Present Illness: Patient is a 39 year old female who presented with urinary tract infection a few days ago. Urine culture grew Escherichia coli. She was started on cephalexin but sensitivities were intermediate for cefazolin. Therefore she was transitioned to Cipro but she developed right flank pain nausea and vomiting. She returned today to the emergency department and underwent CT scan. CT revealed a right lower pole renal abscess, right hydroureteronephrosis down to a 5 mm right ureterovesical junction stone. Her white count was 12.2. She is tachycardic. She was given Rocephin in the ED. She's had fevers and chills and a temperature 101. UA showed too numerous to count white cells but no bacteria. She continues to have right flank pain.  She does have a history of kidney stones and had a stent for a kidney stone about 14 years ago.  Past Medical History  Diagnosis Date  . Migraine   . Kidney stones    Past Surgical History  Procedure Laterality Date  . Tubal ligation    . Appendectomy    . Cesarean section    . Renal artery stent      Home Medications:  Prescriptions prior to admission  Medication Sig Dispense Refill Last Dose  . ciprofloxacin (CIPRO) 500 MG tablet Take 500 mg by mouth 2 (two) times daily.   05/17/2015 at Unknown time  . butalbital-acetaminophen-caffeine (FIORICET) 50-325-40 MG per tablet Take 1-2 tablets by mouth every 6 (six) hours as needed for headache. (Patient not taking: Reported on 05/17/2015) 20 tablet 0   . cephALEXin (KEFLEX) 500 MG capsule Take 1 capsule (500 mg total) by mouth 2 (two) times daily. (Patient not taking: Reported on 05/17/2015) 14 capsule 0   . HYDROcodone-acetaminophen (NORCO) 5-325 MG per tablet Take 1 tablet by mouth every 6 (six) hours as needed for moderate pain. (Patient not taking: Reported on 05/17/2015) 10 tablet  0   . ketorolac (TORADOL) 10 MG tablet Take 1 tablet (10 mg total) by mouth every 8 (eight) hours. (Patient not taking: Reported on 05/17/2015) 15 tablet 0    Allergies:  Allergies  Allergen Reactions  . Tramadol Nausea And Vomiting    No family history on file. Social History:  reports that she has been smoking Cigarettes.  She has been smoking about 0.50 packs per day. She has never used smokeless tobacco. She reports that she does not drink alcohol or use illicit drugs.  ROS: A complete review of systems was performed.  All systems are negative except for pertinent findings as noted. ROS   Physical Exam:  Vital signs in last 24 hours: Temp:  [98.6 F (37 C)-101.9 F (38.8 C)] 101.9 F (38.8 C) (08/10 1900) Pulse Rate:  [103-115] 103 (08/10 1900) Resp:  [18-20] 18 (08/10 1900) BP: (127-150)/(80-100) 127/85 mmHg (08/10 1900) SpO2:  [99 %-100 %] 99 % (08/10 1900) Weight:  [63.504 kg (140 lb)-66.225 kg (146 lb)] 66.225 kg (146 lb) (08/10 1913) General:  Alert and oriented, No acute distress HEENT: Normocephalic, atraumatic Neck: No JVD or lymphadenopathy Cardiovascular: Regular rate and rhythm Lungs: Regular rate and effort Abdomen: Soft, nontender, nondistended, no abdominal masses Extremities: No edema Neurologic: Grossly intact  Laboratory Data:  Results for orders placed or performed during the hospital encounter of 05/17/15 (from the past 24 hour(s))  CBC with Differential/Platelet     Status: Abnormal   Collection Time: 05/17/15  2:19 PM  Result  Value Ref Range   WBC 12.2 (H) 3.6 - 11.0 K/uL   RBC 3.97 3.80 - 5.20 MIL/uL   Hemoglobin 10.7 (L) 12.0 - 16.0 g/dL   HCT 16.1 (L) 09.6 - 04.5 %   MCV 81.6 80.0 - 100.0 fL   MCH 26.9 26.0 - 34.0 pg   MCHC 33.0 32.0 - 36.0 g/dL   RDW 40.9 81.1 - 91.4 %   Platelets 318 150 - 440 K/uL   Neutrophils Relative % 76 %   Neutro Abs 9.4 (H) 1.4 - 6.5 K/uL   Lymphocytes Relative 14 %   Lymphs Abs 1.7 1.0 - 3.6 K/uL   Monocytes  Relative 8 %   Monocytes Absolute 1.0 (H) 0.2 - 0.9 K/uL   Eosinophils Relative 1 %   Eosinophils Absolute 0.1 0 - 0.7 K/uL   Basophils Relative 1 %   Basophils Absolute 0.1 0 - 0.1 K/uL  Comprehensive metabolic panel     Status: Abnormal   Collection Time: 05/17/15  2:19 PM  Result Value Ref Range   Sodium 133 (L) 135 - 145 mmol/L   Potassium 3.3 (L) 3.5 - 5.1 mmol/L   Chloride 96 (L) 101 - 111 mmol/L   CO2 27 22 - 32 mmol/L   Glucose, Bld 116 (H) 65 - 99 mg/dL   BUN 9 6 - 20 mg/dL   Creatinine, Ser 7.82 0.44 - 1.00 mg/dL   Calcium 8.8 (L) 8.9 - 10.3 mg/dL   Total Protein 7.8 6.5 - 8.1 g/dL   Albumin 3.6 3.5 - 5.0 g/dL   AST 15 15 - 41 U/L   ALT 20 14 - 54 U/L   Alkaline Phosphatase 115 38 - 126 U/L   Total Bilirubin 0.2 (L) 0.3 - 1.2 mg/dL   GFR calc non Af Amer >60 >60 mL/min   GFR calc Af Amer >60 >60 mL/min   Anion gap 10 5 - 15  Lipase, blood     Status: Abnormal   Collection Time: 05/17/15  2:19 PM  Result Value Ref Range   Lipase 13 (L) 22 - 51 U/L  Urinalysis complete, with microscopic ([facility] only)     Status: Abnormal   Collection Time: 05/17/15  2:19 PM  Result Value Ref Range   Color, Urine YELLOW (A) YELLOW   APPearance CLEAR (A) CLEAR   Glucose, UA NEGATIVE NEGATIVE mg/dL   Bilirubin Urine NEGATIVE NEGATIVE   Ketones, ur NEGATIVE NEGATIVE mg/dL   Specific Gravity, Urine 1.023 1.005 - 1.030   Hgb urine dipstick 1+ (A) NEGATIVE   pH 5.0 5.0 - 8.0   Protein, ur 30 (A) NEGATIVE mg/dL   Nitrite NEGATIVE NEGATIVE   Leukocytes, UA 2+ (A) NEGATIVE   RBC / HPF 6-30 0 - 5 RBC/hpf   WBC, UA TOO NUMEROUS TO COUNT 0 - 5 WBC/hpf   Bacteria, UA NONE SEEN NONE SEEN   Squamous Epithelial / LPF NONE SEEN NONE SEEN   Mucous PRESENT    Hyaline Casts, UA PRESENT   Pregnancy, urine POC     Status: None   Collection Time: 05/17/15  2:21 PM  Result Value Ref Range   Preg Test, Ur NEGATIVE NEGATIVE  Lactic acid, plasma     Status: None   Collection Time: 05/17/15  4:37 PM   Result Value Ref Range   Lactic Acid, Venous 1.4 0.5 - 2.0 mmol/L  Lactic acid, plasma     Status: None   Collection Time: 05/17/15  7:43 PM  Result  Value Ref Range   Lactic Acid, Venous 1.3 0.5 - 2.0 mmol/L  CBC     Status: Abnormal   Collection Time: 05/17/15  7:52 PM  Result Value Ref Range   WBC 10.5 3.6 - 11.0 K/uL   RBC 3.78 (L) 3.80 - 5.20 MIL/uL   Hemoglobin 10.2 (L) 12.0 - 16.0 g/dL   HCT 16.1 (L) 09.6 - 04.5 %   MCV 81.6 80.0 - 100.0 fL   MCH 27.1 26.0 - 34.0 pg   MCHC 33.2 32.0 - 36.0 g/dL   RDW 40.9 81.1 - 91.4 %   Platelets 292 150 - 440 K/uL  Creatinine, serum     Status: None   Collection Time: 05/17/15  7:52 PM  Result Value Ref Range   Creatinine, Ser 0.67 0.44 - 1.00 mg/dL   GFR calc non Af Amer >60 >60 mL/min   GFR calc Af Amer >60 >60 mL/min   Recent Results (from the past 240 hour(s))  Urine culture     Status: None   Collection Time: 05/12/15 11:24 AM  Result Value Ref Range Status   Specimen Description URINE, CLEAN CATCH  Final   Special Requests none Normal  Final   Culture >=100,000 COLONIES/mL ESCHERICHIA COLI  Final   Report Status 05/14/2015 FINAL  Final   Organism ID, Bacteria ESCHERICHIA COLI  Final      Susceptibility   Escherichia coli - MIC*    AMPICILLIN >=32 RESISTANT Resistant     CEFAZOLIN 16 INTERMEDIATE Intermediate     CEFTRIAXONE <=1 SENSITIVE Sensitive     CIPROFLOXACIN <=0.25 SENSITIVE Sensitive     GENTAMICIN <=1 SENSITIVE Sensitive     IMIPENEM <=0.25 SENSITIVE Sensitive     NITROFURANTOIN <=16 SENSITIVE Sensitive     TRIMETH/SULFA <=20 SENSITIVE Sensitive     PIP/TAZO Value in next row Sensitive      SENSITIVE8    * >=100,000 COLONIES/mL ESCHERICHIA COLI   Creatinine:  Recent Labs  05/17/15 1419 05/17/15 1952  CREATININE 0.78 0.67    Impression/Assessment/plan: Right distal ureteral stone, right hydroureteronephrosis, right renal abscess, sepsis-I believe the first priority will be to decompress the right  system and I discussed with the patient and her daughter the nature, potential benefits, risks and alternatives to cystoscopy with right ureteral stent placement possible ureteroscopy with holmium laser lithotripsy, including side effects of the proposed treatment, the likelihood of the patient achieving the goals of the procedure, and any potential problems that might occur during the procedure or recuperation. We discussed the rationale for simply placing a stent to drain the right system and not manipulating the stone but given the stone size and location I don't believe manipulating the stone with laser will be significantly more work than simply placing a stent. If the stone and her anatomy are favorable we will go ahead and try to get rid of the stone but if there is any difficulty we will simply place a stent. We discussed possibility of not gaining retrograde access and needing a percutaneous nephrostomy tube.   In addition we discussed the right renal abscess is a separate problem we will need to address and we will have interventional radiology consider placing a right percutaneous drain in the abscess tomorrow. Again I do think the first order of priority will need to be getting the pressure/hydronephrosis of the kidney. I do not believe it would be easy to get a nephrostomy tube through the abscess and into the collecting system in one stick  to deal with both the abscess and obstruction as the lower pole collecting system is not very dilated.   All questions answered. Patient elects to proceed. Discussed with patient and daughter stents are temporary and if we do not get the stone out she will need a second procedure to deal with the stone. We discussed importance of follow-up to ensure the stent is removed.     Mattie Novosel 05/17/2015, 9:31 PM

## 2015-05-17 NOTE — ED Provider Notes (Signed)
[facility] Emergency Department Provider Note  ____________________________________________  Time seen: Approximately 2:31 PM  I have reviewed the triage vital signs and the nursing notes.   HISTORY  Chief Complaint Urinary Tract Infection and Emesis    HPI Morgan Dean is a 39 y.o. female with a history of recently diagnosed urinary tract infection and years ago requiring a stent in her right kidney while she was pregnant (she does not remember the details or circumstances) who presents with persistent severe pain and bilateral flanks but much worse on the right, dysuria,and nausea and vomiting.  She reports that she was seen 5 days ago and diagnosed with urinary tract infection and started on antibiotics.  She was able to take that medication, but then was called a couple of days ago and told that she needs a different antibiotics once the culture came back.  She switched to this medication but every time she has taken a dose in the last 48 hours, she vomits within 45 minutes.  She continues to have severe pain in bilateral flanks, worse on right..  She has had subjective fevers. She continues to have severe burning dysuria.  She has been eating and drinking less than usual.   Past Medical History  Diagnosis Date  . Migraine   . Kidney stones     There are no active problems to display for this patient.   Past Surgical History  Procedure Laterality Date  . Tubal ligation    . Appendectomy    . Cesarean section    . Renal artery stent      Current Outpatient Rx  Name  Route  Sig  Dispense  Refill  . butalbital-acetaminophen-caffeine (FIORICET) 50-325-40 MG per tablet   Oral   Take 1-2 tablets by mouth every 6 (six) hours as needed for headache.   20 tablet   0   . cephALEXin (KEFLEX) 500 MG capsule   Oral   Take 1 capsule (500 mg total) by mouth 2 (two) times daily.   14 capsule   0   . HYDROcodone-acetaminophen (NORCO) 5-325 MG per  tablet   Oral   Take 1 tablet by mouth every 6 (six) hours as needed for moderate pain.   10 tablet   0   . ketorolac (TORADOL) 10 MG tablet   Oral   Take 1 tablet (10 mg total) by mouth every 8 (eight) hours.   15 tablet   0     Allergies Tramadol  No family history on file.  Social History Social History  Substance Use Topics  . Smoking status: Current Every Day Smoker -- 0.50 packs/day    Types: Cigarettes  . Smokeless tobacco: Never Used  . Alcohol Use: No    Review of Systems Constitutional: Subjective fever Eyes: No visual changes. ENT: No sore throat. Cardiovascular: Denies chest pain. Respiratory: Denies shortness of breath. Gastrointestinal: Lower abdominal pain radiating from her flanks, primarily on the right.  Nausea and vomiting, primarily after taking antibiotics.  No diarrhea.  No constipation. Genitourinary: Severe burning dysuria Musculoskeletal: Severe pain in bilateral flanks, worse on right Skin: Negative for rash. Neurological: Negative for headaches, focal weakness or numbness.  10-point ROS otherwise negative.  ____________________________________________   PHYSICAL EXAM:  VITAL SIGNS: ED Triage Vitals  Enc Vitals Group     BP 05/17/15 1255 150/84 mmHg     Pulse Rate 05/17/15 1255 115     Resp 05/17/15 1255 20     Temp 05/17/15  1255 98.6 F (37 C)     Temp Source 05/17/15 1255 Oral     SpO2 05/17/15 1255 100 %     Weight 05/17/15 1255 140 lb (63.504 kg)     Height 05/17/15 1255 5\' 7"  (1.702 m)     Head Cir --      Peak Flow --      Pain Score 05/17/15 1254 10     Pain Loc --      Pain Edu? --      Excl. in GC? --     Constitutional: Alert and oriented.  Tearful, appears to be in pain, in mild distress. Eyes: Conjunctivae are normal. PERRL. EOMI. Head: Atraumatic. Nose: No congestion/rhinnorhea. Mouth/Throat: Mucous membranes are moist.  Oropharynx non-erythematous. Neck: No stridor.   Cardiovascular: Tachycardia, regular  rhythm. Grossly normal heart sounds.  Good peripheral circulation. Respiratory: Normal respiratory effort.  No retractions. Lungs CTAB. Gastrointestinal: Soft with generalized, nonfocal tenderness to palpation with no rebound. No distention. No abdominal bruits.  Mild CVA tenderness on the left, severe tenderness on the right Musculoskeletal: No lower extremity tenderness nor edema.  No joint effusions. Neurologic:  Normal speech and language. No gross focal neurologic deficits are appreciated.  Skin:  Skin is warm, dry and intact. No rash noted.   ____________________________________________   LABS (all labs ordered are listed, but only abnormal results are displayed)  Labs Reviewed  CBC WITH DIFFERENTIAL/PLATELET - Abnormal; Notable for the following:    WBC 12.2 (*)    Hemoglobin 10.7 (*)    HCT 32.4 (*)    Neutro Abs 9.4 (*)    Monocytes Absolute 1.0 (*)    All other components within normal limits  COMPREHENSIVE METABOLIC PANEL - Abnormal; Notable for the following:    Sodium 133 (*)    Potassium 3.3 (*)    Chloride 96 (*)    Glucose, Bld 116 (*)    Calcium 8.8 (*)    Total Bilirubin 0.2 (*)    All other components within normal limits  LIPASE, BLOOD - Abnormal; Notable for the following:    Lipase 13 (*)    All other components within normal limits  URINALYSIS COMPLETEWITH MICROSCOPIC ([facility] ONLY) - Abnormal; Notable for the following:    Color, Urine YELLOW (*)    APPearance CLEAR (*)    Hgb urine dipstick 1+ (*)    Protein, ur 30 (*)    Leukocytes, UA 2+ (*)    All other components within normal limits  CBC - Abnormal; Notable for the following:    RBC 3.78 (*)    Hemoglobin 10.2 (*)    HCT 30.8 (*)    All other components within normal limits  URINE CULTURE  LACTIC ACID, PLASMA  LACTIC ACID, PLASMA  CREATININE, SERUM  BASIC METABOLIC PANEL  CBC  POC URINE PREG, ED  POCT PREGNANCY, URINE   ____________________________________________  EKG  Not  indicated ____________________________________________  RADIOLOGY  Aura Camps, Aylla Huffine, personally discussed these images and results by phone with the on-call radiologist and used this discussion as part of my medical decision making.   Ct Abdomen Pelvis W Contrast  05/17/2015   CLINICAL DATA:  Nausea, low back and abdominal pain for 2 weeks, intermittent diarrhea, UTI, on antibiotics, question pyelonephritis or kidney stones, smoker  EXAM: CT ABDOMEN AND PELVIS WITH CONTRAST  TECHNIQUE: Multidetector CT imaging of the abdomen and pelvis was performed using the standard protocol following bolus administration of intravenous contrast. Sagittal and coronal  MPR images reconstructed from axial data set.  CONTRAST:  OMNIPAQUE IOHEXOL 300 MG/ML SOLN IV. Dilute oral contrast.  COMPARISON:  None  FINDINGS: Lung bases clear.  RIGHT hydronephrosis and hydroureter secondary to a 5 mm calculus at the RIGHT ureterovesical junction.  Additionally, abnormal area of attenuation at the inferior pole of the RIGHT kidney, 3.5 x 4.2 x 3.9 cm, containing a thick rim and a central irregular area of low attenuation, compatible with a RIGHT renal abscess.  Adjacent infiltrative/inflammatory changes in perinephric fat at inferior pole RIGHT kidney.  No other focal renal, LEFT ureteral or bladder abnormalities.  Liver, spleen, gallbladder, pancreas, and adrenal glands normal.  Appendix surgically absent by history.  Unremarkable bladder and adnexa.  Stomach and bowel loops unremarkable.  No additional mass, adenopathy, free air, free fluid, or hernia.  Normal sized pericaval nodes noted.  Osseous structures unremarkable.  IMPRESSION: RIGHT hydronephrosis and hydroureter secondary to a 5 mm RIGHT UVJ calculus.  Renal abscess inferior pole RIGHT kidney 3.5 x 4.2 x 3.9 cm with mild adjacent inflammatory changes of RIGHT perinephric fat.  Findings called to Dr. York Cerise on 05/17/2015 at 1610 hours.   Electronically Signed   By: Ulyses Southward M.D.   On: 05/17/2015 16:14     ____________________________________________   PROCEDURES  Procedure(s) performed: None  Critical Care performed: Yes, see critical care note(s)   CRITICAL CARE Performed by: Loleta Rose   Total critical care time: 45 minutes  Critical care time was exclusive of separately billable procedures and treating other patients.  Critical care was necessary to treat or prevent imminent or life-threatening deterioration.  Critical care was time spent personally by me on the following activities: development of treatment plan with patient and/or surrogate as well as nursing, discussions with consultants, evaluation of patient's response to treatment, examination of patient, obtaining history from patient or surrogate, ordering and performing treatments and interventions, ordering and review of laboratory studies, ordering and review of radiographic studies, pulse oximetry and re-evaluation of patient's condition.  ____________________________________________   INITIAL IMPRESSION / ASSESSMENT AND PLAN / ED COURSE  Pertinent labs & imaging results that were available during my care of the patient were reviewed by me and considered in my medical decision making (see chart for details).  The patient is failing outpatient treatment.  Apparently she was initially prescribed Keflex and took it successfully, but then was switched to Cipro (I discussed the Cipro with her pharmacy, [facility], they verified) paced on the culture and sensitivities.  However, she has vomited after every dose of Cipro.  Her urine is still grossly infected and her symptoms are very concerning for pallor nephritis.  Additionally, she has a history of probable kidney stone and a distant history of requiring a stent in her right kidney.  I will evaluate with a CT scan with IV contrast to definitively look for pyelonephritis as well as the possibility of an obstruction.  I am treating her  with a normal saline fluid bolus, morphine, and Zofran, as well as ceftriaxone 1 g IV, which the culture and sensitivity indicates should be effective.  ----------------------------------------- 4:19 PM on 05/17/2015 -----------------------------------------  I discussed the case by phone with the radiologist when he called me with the results.  The patient has an obstructing 5 mm right UVJ calculus with resulting hydronephrosis and hydroureter.  Additionally she has a right inferior pole renal abscess measuring approximately 3 x 4 x 4 cm with adjacent inflammatory changes.  Given that she  meets sepsis criteria and has an obstructing stone, it is important that she be evaluated by urology.  I have paged the on-call urologist, Dr. Mena Goes, for evaluation and management assistance.  I am also giving an additional gram of ceftriaxone IV.  ----------------------------------------- 4:49 PM on 05/17/2015 -----------------------------------------  Dr. Mena Goes called me back and we discussed the case by phone.  He will call the OR and plan on placing a stent to decompress the kidney and address the septic stone.  He requested that I talk to the hospitalist about admission.  He also requested that I contact interventional radiology to discuss a percutaneous drain of the abscess.   I spoke by phone with Dr. Alcide Clever with interventional radiology.  He reviewed the CT scan.  We discussed the plan of care and he will tell the on-call interventional radiologist about the case.  At this time we anticipate that the patient will be appropriate for intervention tomorrow morning.  However, if Dr. Mena Goes feels that it needs to be done emergently, he and the hospitalist can coordinate with on-call interventional radiology.  Dr. Karle Starch will inform the oncoming on-call radiologist about the case and the possibility that intervention may be required urgently/emergently.  I spoke with Dr. Damian Leavell the hospitalist  and let him know about all these conversations and the plan of care.  He will further coordinate the care from here.  I did not originally ordered blood cultures prior to antibiotics administration, so the hospitalist will order the blood cultures which are being drawn now while the second IV is being placed.  As mentioned above, the patient meets sepsis criteria and I am giving 2 L of fluid which is slightly more than 30 mL/kg.  A lactate is pending.    ____________________________________________  FINAL CLINICAL IMPRESSION(S) / ED DIAGNOSES  Final diagnoses:  Sepsis due to Escherichia coli  Ureterolithiasis  Hydronephrosis of left kidney  Hydroureter on left  Complicated UTI (urinary tract infection)  Renal abscess, left      NEW MEDICATIONS STARTED DURING THIS VISIT:  New Prescriptions   No medications on file     Loleta Rose, MD 05/17/15 2151

## 2015-05-17 NOTE — ED Notes (Signed)
Pt reports seen Friday and diagnosed with UTI, states called on Monday and given new antibiotics. Has been unable to keep down due to nausea. Lower back and abdominal pain. Pt tearful in triage.

## 2015-05-17 NOTE — Anesthesia Preprocedure Evaluation (Signed)
Anesthesia Evaluation  Patient identified by MRN, date of birth, ID band Patient awake    Reviewed: Allergy & Precautions, H&P , NPO status , Patient's Chart, lab work & pertinent test results, reviewed documented beta blocker date and time   History of Anesthesia Complications Negative for: history of anesthetic complications  Airway Mallampati: III  TM Distance: >3 FB Neck ROM: full    Dental no notable dental hx. (+) Missing, Chipped, Poor Dentition   Pulmonary neg shortness of breath, neg sleep apnea, neg COPDneg recent URI, Current Smoker,  breath sounds clear to auscultation  Pulmonary exam normal       Cardiovascular Exercise Tolerance: Good negative cardio ROS Normal cardiovascular examRhythm:regular Rate:Normal     Neuro/Psych  Headaches, negative psych ROS   GI/Hepatic negative GI ROS, Neg liver ROS,   Endo/Other  negative endocrine ROS  Renal/GU Renal disease (kidney stones)  negative genitourinary   Musculoskeletal   Abdominal   Peds  Hematology negative hematology ROS (+)   Anesthesia Other Findings Past Medical History:   Migraine                                                     Kidney stones                                               Patient meets SIRS/sepsis criteria.  Reproductive/Obstetrics negative OB ROS                             Anesthesia Physical Anesthesia Plan  ASA: III  Anesthesia Plan: General   Post-op Pain Management:    Induction:   Airway Management Planned:   Additional Equipment:   Intra-op Plan:   Post-operative Plan:   Informed Consent: I have reviewed the patients History and Physical, chart, labs and discussed the procedure including the risks, benefits and alternatives for the proposed anesthesia with the patient or authorized representative who has indicated his/her understanding and acceptance.   Dental Advisory Given  Plan  Discussed with: Anesthesiologist, CRNA and Surgeon  Anesthesia Plan Comments:         Anesthesia Quick Evaluation

## 2015-05-17 NOTE — H&P (Signed)
[facility] -  at [facility]   PATIENT NAME: Morgan Dean    MR#:  [id number]  DATE OF BIRTH:  09-May-1976  DATE OF ADMISSION:  05/17/2015  PRIMARY CARE PHYSICIAN: No PCP Per Patient   REQUESTING/REFERRING PHYSICIAN: Dr York Cerise  CHIEF COMPLAINT:  Ureter stone  HISTORY OF PRESENT ILLNESS:  Morgan Dean  is a 39 y.o. female with a known history of kidney stones who presents with right-sided flank pain and nausea. She was diagnosed on Friday for urinary tract infection and prescribed antibiotics. Patient is very tearful. Patient had a CT scan today which shows right-sided hydronephrosis and hydroureter with a stone in the ureter.  PAST MEDICAL HISTORY:   Past Medical History  Diagnosis Date  . Migraine   . Kidney stones     PAST SURGICAL HISTORY:   Past Surgical History  Procedure Laterality Date  . Tubal ligation    . Appendectomy    . Cesarean section    . Renal artery stent      SOCIAL HISTORY:   Social History  Substance Use Topics  . Smoking status: Current Every Day Smoker -- 0.50 packs/day    Types: Cigarettes  . Smokeless tobacco: Never Used  . Alcohol Use: No    FAMILY HISTORY:  No history of CAD, hypertension  DRUG ALLERGIES:   Allergies  Allergen Reactions  . Tramadol Nausea And Vomiting     REVIEW OF SYSTEMS:  CONSTITUTIONAL: No fever, fatigue or weakness.  EYES: No blurred or double vision.  EARS, NOSE, AND THROAT: No tinnitus or ear pain.  RESPIRATORY: No cough, shortness of breath, wheezing or hemoptysis.  CARDIOVASCULAR: No chest pain, orthopnea, edema.  GASTROINTESTINAL: No nausea, vomiting, diarrhea or abdominal pain.  GENITOURINARY: No dysuria, hematuria.  ENDOCRINE: No polyuria, nocturia,  HEMATOLOGY: No anemia, easy bruising or bleeding SKIN: No rash or lesion. MUSCULOSKELETAL: Positive back pain  NEUROLOGIC: No tingling, numbness, weakness.  PSYCHIATRY: No anxiety or depression. Patient is  very tearful  MEDICATIONS AT HOME:   Prior to Admission medications   Medication Sig Start Date End Date Taking? Authorizing Provider  ciprofloxacin (CIPRO) 500 MG tablet Take 500 mg by mouth 2 (two) times daily.   Yes Historical Provider, MD  butalbital-acetaminophen-caffeine (FIORICET) 50-325-40 MG per tablet Take 1-2 tablets by mouth every 6 (six) hours as needed for headache. Patient not taking: Reported on 05/17/2015 04/24/15 04/23/16  Jene Every, MD  cephALEXin (KEFLEX) 500 MG capsule Take 1 capsule (500 mg total) by mouth 2 (two) times daily. Patient not taking: Reported on 05/17/2015 05/12/15 05/19/15  Charlesetta Ivory Menshew, PA-C  HYDROcodone-acetaminophen (NORCO) 5-325 MG per tablet Take 1 tablet by mouth every 6 (six) hours as needed for moderate pain. Patient not taking: Reported on 05/17/2015 05/12/15   Charlesetta Ivory Menshew, PA-C  ketorolac (TORADOL) 10 MG tablet Take 1 tablet (10 mg total) by mouth every 8 (eight) hours. Patient not taking: Reported on 05/17/2015 05/12/15   Charlesetta Ivory Menshew, PA-C      VITAL SIGNS:  Blood pressure 131/100, pulse 112, temperature 98.8 F (37.1 C), temperature source Oral, resp. rate 18, height  (1.702 m), weight 63.504 kg (140 lb), last menstrual period 04/19/2015, SpO2 99 %.  PHYSICAL EXAMINATION:  GENERAL:  39 y.o.-year-old patient lying in the bed very tearful and in pain  EYES: Pupils equal, round, reactive to light and accommodation. No scleral icterus. Extraocular muscles intact.  HEENT: Head atraumatic, normocephalic. Oropharynx and nasopharynx clear.  NECK:  Supple, no jugular venous distention. No thyroid enlargement, no tenderness.  LUNGS: Normal breath sounds bilaterally, no wheezing, rales,rhonchi or crepitation. No use of accessory muscles of respiration.  CARDIOVASCULAR: S1, S2 normal. No murmurs, rubs, or gallops.  ABDOMEN: Soft, nontender, nondistended. Bowel sounds present. No organomegaly or mass. She has right-sided CVA  tenderness EXTREMITIES: No pedal edema, cyanosis, or clubbing.  NEUROLOGIC: Cranial nerves II through XII are grossly intact. No focal deficits. PSYCHIATRIC: The patient is alert and oriented x 3.  SKIN: No obvious rash, lesion, or ulcer.   LABORATORY PANEL:   CBC  Recent Labs Lab 05/17/15 1419  WBC 12.2*  HGB 10.7*  HCT 32.4*  PLT 318   ------------------------------------------------------------------------------------------------------------------  Chemistries   Recent Labs Lab 05/17/15 1419  NA 133*  K 3.3*  CL 96*  CO2 27  GLUCOSE 116*  BUN 9  CREATININE 0.78  CALCIUM 8.8*  AST 15  ALT 20  ALKPHOS 115  BILITOT 0.2*   ------------------------------------------------------------------------------------------------------------------  Cardiac Enzymes No results for input(s): TROPONINI in the last 168 hours. ------------------------------------------------------------------------------------------------------------------  RADIOLOGY:  Ct Abdomen Pelvis W Contrast  05/17/2015   CLINICAL DATA:  Nausea, low back and abdominal pain for 2 weeks, intermittent diarrhea, UTI, on antibiotics, question pyelonephritis or kidney stones, smoker  EXAM: CT ABDOMEN AND PELVIS WITH CONTRAST  TECHNIQUE: Multidetector CT imaging of the abdomen and pelvis was performed using the standard protocol following bolus administration of intravenous contrast. Sagittal and coronal MPR images reconstructed from axial data set.  CONTRAST:  OMNIPAQUE IOHEXOL 300 MG/ML SOLN IV. Dilute oral contrast.  COMPARISON:  None  FINDINGS: Lung bases clear.  RIGHT hydronephrosis and hydroureter secondary to a 5 mm calculus at the RIGHT ureterovesical junction.  Additionally, abnormal area of attenuation at the inferior pole of the RIGHT kidney, 3.5 x 4.2 x 3.9 cm, containing a thick rim and a central irregular area of low attenuation, compatible with a RIGHT renal abscess.  Adjacent infiltrative/inflammatory  changes in perinephric fat at inferior pole RIGHT kidney.  No other focal renal, LEFT ureteral or bladder abnormalities.  Liver, spleen, gallbladder, pancreas, and adrenal glands normal.  Appendix surgically absent by history.  Unremarkable bladder and adnexa.  Stomach and bowel loops unremarkable.  No additional mass, adenopathy, free air, free fluid, or hernia.  Normal sized pericaval nodes noted.  Osseous structures unremarkable.  IMPRESSION: RIGHT hydronephrosis and hydroureter secondary to a 5 mm RIGHT UVJ calculus.  Renal abscess inferior pole RIGHT kidney 3.5 x 4.2 x 3.9 cm with mild adjacent inflammatory changes of RIGHT perinephric fat.  Findings called to Dr. York Cerise on 05/17/2015 at 1610 hours.   Electronically Signed   By: Ulyses Southward M.D.   On: 05/17/2015 16:14    EKG:   Orders placed or performed in visit on 08/26/14  . EKG 12-Lead    IMPRESSION AND PLAN:  Visit 39 year old female with a history of kidney stones and tobacco abuse who presented with nausea and back pain and found to have right hydronephrosis and hydroureter secondary to a 5 mm right UVJ calculus.  1. Ureter stone right side with hydronephrosis and hydroureter: I spoken with urology. Plan is for patient to go to the operating room for stone removal possible stent placement. Patient may also need a tube for the renal abscess that is seen on CT scan. Interventional radiology has been consulted for drain placement tomorrow. Continue supportive care with pain medications and empiric antibiotics.  2. Tobacco dependence: Patient is encouraged  sepsis. Patient was counseled for 3 minutes. Patient does want nicotine patch.  3.  Sinus tachycardia: This is secondary to pain and anxiety. Patient will be placed on telemetry further monitoring.  4. Hypokalemia: Potassium will be repleted and rechecked in a.m.   All the records are reviewed and case discussed with ED provider. Management plans discussed with the patient and she is  in agreement.  CODE STATUS: Full  TOTAL TIME TAKING CARE OF THIS PATIENT: 50 minutes.    Morgan Dean M.D on 05/17/2015 at 5:14 PM  Between 7am to 6pm - Pager - [phone] After 6pm go to [email or web address] - password [email or web address] [facility]  [place] [facility]  Office  [phone]  CC: Primary care physician; No PCP Per Patient

## 2015-05-17 NOTE — Anesthesia Procedure Notes (Signed)
Procedure Name: Intubation Date/Time: 05/17/2015 10:21 PM Performed by: Waldo Laine Pre-anesthesia Checklist: Patient identified, Emergency Drugs available, Suction available, Patient being monitored and Timeout performed Patient Re-evaluated:Patient Re-evaluated prior to inductionOxygen Delivery Method: Circle system utilized Preoxygenation: Pre-oxygenation with 100% oxygen Intubation Type: IV induction Ventilation: Mask ventilation without difficulty Laryngoscope Size: Miller and 2 Grade View: Grade II Tube type: Oral Tube size: 7.0 mm Number of attempts: 1 Airway Equipment and Method: Stylet Placement Confirmation: ETT inserted through vocal cords under direct vision,  positive ETCO2 and breath sounds checked- equal and bilateral Secured at: 19 cm Tube secured with: Tape Dental Injury: Teeth and Oropharynx as per pre-operative assessment

## 2015-05-17 NOTE — Op Note (Signed)
Preoperative diagnosis: Right renal abscess, right distal ureteral stone, right hydronephrosis, sepsis Postoperative diagnosis: Same  Procedure: Cystoscopy, Right retrograde pyelogram, Right ureteroscopy with holmium laser lithotripsy, stone basket extraction, Right ureteral stent placement  Surgeon: Armando Lauman  Type of anesthesia: Gen.  Indication for procedure: Patient is a 39 year old female with a very distal right ureteral stone, right Hydro, right lower pole renal abscess and sepsis who presents for right ureteral stent to decompress the right collecting system and potential ureteroscopy if the stone was readily  accessible and easy to manipulate.  Findings: On cystoscopy the urethra and the bladder were normal. There were no stones or foreign bodies in the bladder. The right intramural ureter was heaped up some edema and erythema consistent with stone in the intramural ureter indicating it was very distal and almost in the bladder. It was visible on the scout.   Right retrograde pyelogram-this outlined a filling defect in the very distal ureter just inside the ureteral orifice with proximal mild dilation of the ureter and collecting system. There was a single ureter single collecting system unit.  On ureteroscopy in the distant was right inside the ureteral orifice. It was impacted and touched with the laser which broke it freeing the ureter where it was grasped and removed with minimal manipulation.  Description of procedure: After consent was obtained patient brought to the operating room. After adequate anesthesiaa lithotomy position and prepped and draped in the usual sterile fashion. The bladder was drained and irrigated. The urine in the bladder was quite clear. A 5 French open-ended catheter was used to cannulate the right ureteral orifice and retrograde injection of contrast was performed. A sensor wire was advanced and coiled in the collecting system. Once the wire was passed clear  efflux emanated from the ureteral orifice. The semirigid ureteroscope was passed and with the irrigation on minimal flow the right ureteral orifice was entered in the stone immediately encountered. A 365  on the laser fiber was advanced and in the setting of 0.2 and 50 the stone was fragmented. The laser tip actually broke and a new laser was passed. The stone dusted where the laser touched and enough was fragmented to where it broke free and prep revealed retrograde into the ureter proper out of the intramural ureter. Here it was surrounded with the Nitinol basket and removed intact without difficulty. It was sent to the lab for analysis. A quick inspection of the ureter noted to be free of further stone or injury. I was able to scope the distal ureter. Again irrigation was kept to a slow dribble. The wire was backloaded on the cystoscope and a 6 x 26 cm stent was advanced. The wire was removed with a good coil seen in the collecting system and a good coil in the bladder. I did find the tip of the first laser fiber and it was evacuated intact. The scope was removed and a 16 [place] Foley was passed to maximally drain the system and the urine was clear. She was awakened taken to recovery room stable condition.  Complications: None Blood loss: Minimal  Drains: 6 x 26 cm right ureteral stent - no tether  Specimens: Stone to lab for analysis

## 2015-05-17 NOTE — Transfer of Care (Signed)
Immediate Anesthesia Transfer of Care Note  Patient: Morgan Dean  Procedure(s) Performed: Procedure(s): CYSTOSCOPY WITH RETROGRADE PYELOGRAM, URETEROSCOPY AND STENT PLACEMENT (Right) URETEROSCOPY WITH HOLMIUM LASER LITHOTRIPSY  Patient Location: PACU  Anesthesia Type:General  Level of Consciousness: awake, alert , oriented and patient cooperative  Airway & Oxygen Therapy: Patient Spontanous Breathing  Post-op Assessment: Report given to RN and Post -op Vital signs reviewed and stable  Post vital signs: Reviewed and stable  Last Vitals:  Filed Vitals:   05/17/15 1900  BP: 127/85  Pulse: 103  Temp: 38.8 C  Resp: 18    Complications: No apparent anesthesia complications

## 2015-05-18 ENCOUNTER — Encounter: Payer: Self-pay | Admitting: Urology

## 2015-05-18 ENCOUNTER — Ambulatory Visit
Admit: 2015-05-18 | Discharge: 2015-05-18 | Disposition: A | Discharge status: Home / Self Care | Payer: BLUE CROSS/BLUE SHIELD | Attending: Urology | Admitting: Urology

## 2015-05-18 LAB — CBC
HCT: 33.7 % — ABNORMAL LOW (ref 35.0–47.0)
Hemoglobin: 10.8 g/dL — ABNORMAL LOW (ref 12.0–16.0)
MCH: 26.9 pg (ref 26.0–34.0)
MCHC: 32 g/dL (ref 32.0–36.0)
MCV: 83.9 fL (ref 80.0–100.0)
PLATELETS: 257 10*3/uL (ref 150–440)
RBC: 4.02 MIL/uL (ref 3.80–5.20)
RDW: 14.8 % — ABNORMAL HIGH (ref 11.5–14.5)
WBC: 9.9 10*3/uL (ref 3.6–11.0)

## 2015-05-18 LAB — BASIC METABOLIC PANEL
ANION GAP: 11 (ref 5–15)
CHLORIDE: 104 mmol/L (ref 101–111)
CO2: 21 mmol/L — ABNORMAL LOW (ref 22–32)
Calcium: 8.2 mg/dL — ABNORMAL LOW (ref 8.9–10.3)
Creatinine, Ser: 0.64 mg/dL (ref 0.44–1.00)
GFR calc Af Amer: >60 mL/min (ref >60)
GFR calc non Af Amer: >60 mL/min (ref >60)
Glucose, Bld: 103 mg/dL — ABNORMAL HIGH (ref 65–99)
POTASSIUM: 3.8 mmol/L (ref 3.5–5.1)
Sodium: 136 mmol/L (ref 135–145)

## 2015-05-18 MED ORDER — MIDAZOLAM HCL 5 MG/5ML IJ SOLN
INTRAMUSCULAR | Status: AC | PRN
  Administered 2015-05-18 (×2): 0.5 mg via INTRAVENOUS
  Administered 2015-05-18: 1 mg via INTRAVENOUS
  Administered 2015-05-18: 0.5 mg via INTRAVENOUS

## 2015-05-18 MED ORDER — SODIUM CHLORIDE 0.9 % IJ SOLN
5.0000 mL | Freq: Two times a day (BID) | INTRAMUSCULAR | Status: DC
  Administered 2015-05-18 – 2015-05-20 (×4): 5 mL

## 2015-05-18 MED ORDER — FENTANYL CITRATE (PF) 100 MCG/2ML IJ SOLN
INTRAMUSCULAR | Status: AC | PRN
  Administered 2015-05-18: 50 ug via INTRAVENOUS
  Administered 2015-05-18: 25 ug via INTRAVENOUS

## 2015-05-18 MED ORDER — FENTANYL CITRATE (PF) 100 MCG/2ML IJ SOLN
INTRAMUSCULAR | Status: AC
  Filled 2015-05-18: qty 4

## 2015-05-18 MED ORDER — DEXTROSE 5 % IV SOLN
2.0000 g | INTRAVENOUS | Status: DC
  Administered 2015-05-18 – 2015-05-19 (×2): 2 g via INTRAVENOUS
  Filled 2015-05-18 (×3): qty 2

## 2015-05-18 MED ORDER — MIDAZOLAM HCL 5 MG/5ML IJ SOLN
INTRAMUSCULAR | Status: AC
  Filled 2015-05-18: qty 5

## 2015-05-18 NOTE — Progress Notes (Signed)
Initial Nutrition Assessment  DOCUMENTATION CODES:      INTERVENTION:  Coordination of care: Await diet progression   NUTRITION DIAGNOSIS:   Inadequate oral intake related to acute illness as evidenced by NPO status.    GOAL:   Patient will meet greater than or equal to 90% of their needs    MONITOR:    (Energy intake, electrolyte and renal profile)  REASON FOR ASSESSMENT:   Malnutrition Screening Tool    ASSESSMENT:      Pt s/p laser right ureteral stone, right lower renal abscess, sepsis, hydronephrosis  Past Medical History  Diagnosis Date  . Migraine   . Kidney stones     Current Nutrition: NPO, noted ate 100% last shift  Food/Nutrition-Related History: unable to obtain as pt out of room during rounds this pm.  Per MST poor po intake   Medications: NS at 156ml/hr, dulcolax, senokot, KCL  Electrolyte/Renal Profile and Glucose Profile:   Recent Labs Lab 05/17/15 1419 05/17/15 1952 05/18/15 0332  NA 133*  --  136  K 3.3*  --  3.8  CL 96*  --  104  CO2 27  --  21*  BUN 9  --  <5*  CREATININE 0.78 0.67 0.64  CALCIUM 8.8*  --  8.2*  GLUCOSE 116*  --  103*   Protein Profile:  Recent Labs Lab 05/17/15 1419  ALBUMIN 3.6     Last BM: 8/10   Nutrition-Focused Physical Exam Findings:  Unable to complete Nutrition-Focused physical exam at this time.     Weight Change: noted per wt encounters weight loss of 4% in the last month     Diet Order:  Diet NPO time specified  Skin:   reviewed   Height:   Ht Readings from Last 1 Encounters:  05/17/15  (1.702 m)    Weight:   Wt Readings from Last 1 Encounters:  05/17/15 146 lb (66.225 kg)     BMI:  Body mass index is 22.86 kg/(m^2).  Estimated Nutritional Needs:   Kcal:  BEE 1357 kcals (IF 1.0-1.3, AF 1.3) 6045-4098 kcals/d  Protein:  (1.0-1.2 g/d) 66-79 g/d  Fluid:  (25-21ml/kg) 1650-199ml/d  EDUCATION NEEDS:   No education needs identified at this  time  MODERATE Care Level  Zakkiyya Barno B. Freida Busman, RD, LDN [phone] (pager)

## 2015-05-18 NOTE — Plan of Care (Signed)
Problem: Consults Goal: UTI/Pyelonephritis Patient Education See Patient Education Module for education specifics. Outcome: Progressing Pt recently d/c'd from hospital for UTI. Pt readmitted and sent to the OR for laser R Ureteral stone, R lower renal abscess and sepsis and hydronephrosis. Stent and foley placed in OR. Pt returned to room c/o of cramping pain. Pain managed throughout the night.

## 2015-05-18 NOTE — Progress Notes (Signed)
[facility] - [place] [facility]   PATIENT NAME: Morgan Dean    MR#:  [id number]  DATE OF BIRTH:  10/17/1975  SUBJECTIVE:  CHIEF COMPLAINT:  Pt is feeling sick, reporting suprapubic discomfort and pain. Nauseous, spiked temp last night and today after CT guided abscess drainage   REVIEW OF SYSTEMS:  CONSTITUTIONAL: Has fever, fatigue  And  weakness.  EYES: No blurred or double vision.  EARS, NOSE, AND THROAT: No tinnitus or ear pain.  RESPIRATORY: No cough, shortness of breath, wheezing or hemoptysis.  CARDIOVASCULAR: No chest pain, orthopnea, edema.  GASTROINTESTINAL: No nausea, vomiting, diarrhea or abdominal pain.  GENITOURINARY: No hematuria. Feeling pressure in suprapubic area ENDOCRINE: No polyuria, nocturia,  HEMATOLOGY: No anemia, easy bruising or bleeding SKIN: No rash or lesion. MUSCULOSKELETAL: No joint pain or arthritis.   NEUROLOGIC: No tingling, numbness, weakness.  PSYCHIATRY: No anxiety or depression.   DRUG ALLERGIES:   Allergies  Allergen Reactions  . Tramadol Nausea And Vomiting    VITALS:  Blood pressure 129/77, pulse 111, temperature 99.7 F (37.6 C), temperature source Oral, resp. rate 14, height 5\' 7"  (1.702 m), weight 66.225 kg (146 lb), last menstrual period 04/19/2015, SpO2 97 %.  PHYSICAL EXAMINATION:  GENERAL:  39 y.o.-year-old patient lying in the bed with no acute distress.  EYES: Pupils equal, round, reactive to light and accommodation. No scleral icterus. Extraocular muscles intact.  HEENT: Head atraumatic, normocephalic. Oropharynx and nasopharynx clear.  NECK:  Supple, no jugular venous distention. No thyroid enlargement, no tenderness.  LUNGS: Normal breath sounds bilaterally, no wheezing, rales,rhonchi or crepitation. No use of accessory muscles of respiration.  CARDIOVASCULAR: S1, S2 normal. No murmurs, rubs, or gallops.  ABDOMEN: Soft, suprapubic tenderness, no rebound nondistended. Bowel sounds present.  No organomegaly or mass. Rt CVA tenderness is present EXTREMITIES: No pedal edema, cyanosis, or clubbing.  NEUROLOGIC: Cranial nerves II through XII are intact. Muscle strength 5/5 in all extremities. Sensation intact. Gait not checked.  PSYCHIATRIC: The patient is alert and oriented x 3.  SKIN: No obvious rash, lesion, or ulcer.    LABORATORY PANEL:   CBC  Recent Labs Lab 05/18/15 0332  WBC 9.9  HGB 10.8*  HCT 33.7*  PLT 257   ------------------------------------------------------------------------------------------------------------------  Chemistries   Recent Labs Lab 05/17/15 1419  05/18/15 0332  NA 133*  --  136  K 3.3*  --  3.8  CL 96*  --  104  CO2 27  --  21*  GLUCOSE 116*  --  103*  BUN 9  --  <5*  CREATININE 0.78  < > 0.64  CALCIUM 8.8*  --  8.2*  AST 15  --   --   ALT 20  --   --   ALKPHOS 115  --   --   BILITOT 0.2*  --   --   < > = values in this interval not displayed. ------------------------------------------------------------------------------------------------------------------  Cardiac Enzymes No results for input(s): TROPONINI in the last 168 hours. ------------------------------------------------------------------------------------------------------------------  RADIOLOGY:  Ct Abdomen Pelvis W Contrast  05/17/2015   CLINICAL DATA:  Nausea, low back and abdominal pain for 2 weeks, intermittent diarrhea, UTI, on antibiotics, question pyelonephritis or kidney stones, smoker  EXAM: CT ABDOMEN AND PELVIS WITH CONTRAST  TECHNIQUE: Multidetector CT imaging of the abdomen and pelvis was performed using the standard protocol following bolus administration of intravenous contrast. Sagittal and coronal MPR images reconstructed from axial data set.  CONTRAST:  OMNIPAQUE IOHEXOL 300 MG/ML  SOLN IV. Dilute oral contrast.  COMPARISON:  None  FINDINGS: Lung bases clear.  RIGHT hydronephrosis and hydroureter secondary to a 5 mm calculus at the RIGHT  ureterovesical junction.  Additionally, abnormal area of attenuation at the inferior pole of the RIGHT kidney, 3.5 x 4.2 x 3.9 cm, containing a thick rim and a central irregular area of low attenuation, compatible with a RIGHT renal abscess.  Adjacent infiltrative/inflammatory changes in perinephric fat at inferior pole RIGHT kidney.  No other focal renal, LEFT ureteral or bladder abnormalities.  Liver, spleen, gallbladder, pancreas, and adrenal glands normal.  Appendix surgically absent by history.  Unremarkable bladder and adnexa.  Stomach and bowel loops unremarkable.  No additional mass, adenopathy, free air, free fluid, or hernia.  Normal sized pericaval nodes noted.  Osseous structures unremarkable.  IMPRESSION: RIGHT hydronephrosis and hydroureter secondary to a 5 mm RIGHT UVJ calculus.  Renal abscess inferior pole RIGHT kidney 3.5 x 4.2 x 3.9 cm with mild adjacent inflammatory changes of RIGHT perinephric fat.  Findings called to Dr. York Cerise on 05/17/2015 at 1610 hours.   Electronically Signed   By: Ulyses Southward M.D.   On: 05/17/2015 16:14   Ct Image Guided Drainage By Percutaneous Catheter  05/18/2015   CLINICAL DATA:  39 year old female with a history of right-sided renal abscess.  EXAM: CT GUIDED DRAINAGE OF RIGHT KIDNEY ABSCESS  ANESTHESIA/SEDATION: 2.5 Mg IV Versed 75 mcg IV Fentanyl  Total Moderate Sedation Time:  25 minutes  PROCEDURE: The procedure, risks, benefits, and alternatives were explained to the patient and the patient's family. Questions regarding the procedure were encouraged and answered. The patient understands and consents to the procedure.  The patient is was positioned in the prone position on the CT gantry table and a scout CT was performed.  The right flank was prepped with chlorhexidinein a sterile fashion, and a sterile drape was applied covering the operative field. A sterile gown and sterile gloves were used for the procedure. Local anesthesia was provided with 1% Lidocaine.   Once the patient is prepped and draped in the usual sterile fashion, the skin and subcutaneous tissues were generously infiltrated with 1% lidocaine for local anesthesia. A small stab incision was made with 11 blade scalpel common using CT guidance, a trocar needle was advanced into the region of the abscess at the right kidney inferior pole. Once the tip of the needle was confirmed with CT imaging, the stylet was removed and a 035 wire was advanced into the collection. The needle was removed, serial dilation was performed within the soft tissues, and a 10 French drain was placed over the wire.  Drain position was confirmed after removal of the wire with aspiration of purulent material and a final CT images.  The pigtail catheter was locked and sutured in position at the skin.  A sample of approximately 12 cc of purulent fluid was aspirated and sent to the lab.  Patient tolerated the procedure well and remained hemodynamically stable throughout.  No complications were encountered and no significant blood loss was encountered.  COMPLICATIONS: None  FINDINGS: Noncontrast CT image demonstrates edematous changes of the right kidney with a contour abnormality, compatible with the known abscess.  Final image during the case demonstrate 10 French pigtail catheter in the inferior pole of the right kidney, in the region of the abscess.  A sample of 12 cc purulent material sent to the lab for analysis.  IMPRESSION: Status post CT-guided drainage of right kidney abscess. Sample was sent  to the lab for analysis.  Signed,  Yvone Neu. Loreta Ave, DO  [facility]  [facility]  PLAN: The drain should be maintained for at least a week to 10 days. This was discussed with Dr. Edwyna Shell of urology team, as well as the patient and the patient's family.  Repeat CT imaging may be performed as an outpatient to determine resolution of the fluid. At that time the drain may be removed.   Electronically  Signed   By: Gilmer Mor D.O.   On: 05/18/2015 16:22    EKG:   Orders placed or performed in visit on 08/26/14  . EKG 12-Lead    ASSESSMENT AND PLAN:   Visit 39 year old female with a history of kidney stones and tobacco abuse who presented with nausea and back pain and found to have right hydronephrosis and hydroureter secondary to a 5 mm right UVJ calculus.   Sepsis with rt renal abscess- febrile and tachycardic S/p CT guided percutaneius drain placement by IR 8/11 IVF Blood cx ordered today abx changed to zosyn and vanc  1. Ureter stone right side with hydronephrosis and hydroureter:  Urology placed stent 8/10   2. Tobacco dependence: Patient is encouraged sepsis. Patient was counseled for 3 minutes. Patient does want nicotine patch.  3. Sinus tachycardia: This is secondary to pain and anxiety. Patient will be placed on telemetry further monitoring.  4. Hypokalemia: Potassium will be repleted and rechecked prn       All the records are reviewed and case discussed with Care Management/Social Workerr. Management plans discussed with the patient, family and they are in agreement.  CODE STATUS: full  TOTAL TIME TAKING CARE OF THIS PATIENT: 35 minutes.   POSSIBLE D/C IN 2-3 DAYS, DEPENDING ON CLINICAL CONDITION.   Ramonita Lab M.D on 05/18/2015 at 6:59 PM  Between 7am to 6pm - Pager - [phone] After 6pm go to [email or web address] - password [email or web address] [facility]  [place] [facility]  Office  [phone]  CC: Primary care physician; No PCP Per Patient

## 2015-05-18 NOTE — Procedures (Signed)
Interventional Radiology Procedure Note  Procedure: CT guided drain of right renal abscess.  8F drain placed.  Aspiration for lab sample.  Complications: None Recommendations:  - Ok to shower tomorrow - Do not submerge - Routine drain care, with daily flushes with sterile saline   Signed,  Yvone Neu. Loreta Ave, DO

## 2015-05-18 NOTE — Progress Notes (Signed)
Urology progress note Patient seen this morning with complaint of left flank pain and suprapubic pain. She realizes that these Foley catheter causing suprapubic pain and urgency of urination. She also realizes that on the right side is causing her pain. Calculus was removed last night by Dr. Mena Goes the on-call surgeon. She had a stent placed at that time without detachable string. The the Foley catheter is going to be left in this morning until she has a left percutaneous nephrostomy for drainage of a inferior pole abscess. This is a well walled off abscess. There are no labs this morning yet on the chart. She had a white count of 9.9 last night with a hemoglobin of 10.6  This morning her abdomen is soft with some positive Lloyd's on both sides. The left being a little worse than the right. She has some suprapubic discomfort. Foley is draining well with some evidence of irritative blood in the urinary drainage bag with the tubing is light pink to clear.  She remains afebrile this morning. She is nothing by mouth until her procedure later this morning. I will follow-up with her later today to ensure that the percutaneous nephrostomy tube was in place and at that time I think DC her Foley catheter. Urine output remains good she's being hydrated. Creatinine was 0.6 on admission which is good for this patient considering the number problems she has bilaterally in her collecting system. Stent will have to be removed in the office in follow-up in approximately 1 week. I suspect the patient made in the hospital over the weekend.

## 2015-05-19 LAB — BASIC METABOLIC PANEL
ANION GAP: 5 (ref 5–15)
BUN: <5 mg/dL — ABNORMAL LOW (ref 6–20)
CO2: 24 mmol/L (ref 22–32)
Calcium: 7.8 mg/dL — ABNORMAL LOW (ref 8.9–10.3)
Chloride: 109 mmol/L (ref 101–111)
Creatinine, Ser: 0.47 mg/dL (ref 0.44–1.00)
Glucose, Bld: 114 mg/dL — ABNORMAL HIGH (ref 65–99)
POTASSIUM: 3.5 mmol/L (ref 3.5–5.1)
SODIUM: 138 mmol/L (ref 135–145)

## 2015-05-19 LAB — CBC WITH DIFFERENTIAL/PLATELET
BASOS ABS: 0.1 10*3/uL (ref 0–0.1)
Basophils Relative: 1 %
EOS PCT: 3 %
Eosinophils Absolute: 0.2 10*3/uL (ref 0–0.7)
HEMATOCRIT: 28.9 % — AB (ref 35.0–47.0)
HEMOGLOBIN: 9.4 g/dL — AB (ref 12.0–16.0)
LYMPHS ABS: 1.9 10*3/uL (ref 1.0–3.6)
LYMPHS PCT: 27 %
MCH: 26.9 pg (ref 26.0–34.0)
MCHC: 32.3 g/dL (ref 32.0–36.0)
MCV: 83.1 fL (ref 80.0–100.0)
MONOS PCT: 13 %
Monocytes Absolute: 0.9 10*3/uL (ref 0.2–0.9)
NEUTROS ABS: 4.1 10*3/uL (ref 1.4–6.5)
Neutrophils Relative %: 56 %
Platelets: 224 10*3/uL (ref 150–440)
RBC: 3.48 MIL/uL — ABNORMAL LOW (ref 3.80–5.20)
RDW: 14.8 % — ABNORMAL HIGH (ref 11.5–14.5)
WBC: 7.2 10*3/uL (ref 3.6–11.0)

## 2015-05-19 LAB — URINE CULTURE: CULTURE: NO GROWTH

## 2015-05-19 MED ORDER — OXYCODONE-ACETAMINOPHEN 5-325 MG PO TABS
1.0000 | ORAL_TABLET | Freq: Four times a day (QID) | ORAL | Status: DC | PRN
  Administered 2015-05-19: 1 via ORAL
  Filled 2015-05-19: qty 1

## 2015-05-19 MED ORDER — HYDROMORPHONE HCL 1 MG/ML IJ SOLN
1.0000 mg | INTRAMUSCULAR | Status: DC | PRN
  Administered 2015-05-19 – 2015-05-20 (×4): 1 mg via INTRAVENOUS
  Filled 2015-05-19 (×4): qty 1

## 2015-05-19 MED ORDER — OXYCODONE-ACETAMINOPHEN 5-325 MG PO TABS
1.0000 | ORAL_TABLET | ORAL | Status: DC | PRN
  Administered 2015-05-19 – 2015-05-20 (×5): 2 via ORAL
  Filled 2015-05-19 (×5): qty 2

## 2015-05-19 NOTE — Progress Notes (Signed)
Nutrition Follow-up  DOCUMENTATION CODES:      INTERVENTION:  Meals and snacks: Cater to pt preferences. Will send snacks TID between meals for added nutrition.  Pt agreeable   NUTRITION DIAGNOSIS:   Inadequate oral intake related to acute illness as evidenced by NPO status, being addressed with diet progression and adding snacks    GOAL:   Patient will meet greater than or equal to 90% of their needs    MONITOR:    (Energy intake, electrolyte and renal profile)  REASON FOR ASSESSMENT:   Malnutrition Screening Tool    ASSESSMENT:      Pt s/p CT guided percutaneous drain    Current Nutrition: Pt reports eating small amounts of meals.  Noted at 50% of breakfast this am.  Reports she prefers to eat small frequent meals throughout the day.   Food/Nutrition History: Pt reports appetite has been decreased for few days prior to admission secondary to pain but has still being trying to eat.  Last BM: 8/10   Medications: reviewed  Electrolyte/Renal Profile and Glucose Profile:   Recent Labs Lab 05/17/15 1419 05/17/15 1952 05/18/15 0332 05/19/15 0338  NA 133*  --  136 138  K 3.3*  --  3.8 3.5  CL 96*  --  104 109  CO2 27  --  21* 24  BUN 9  --  <5* <5*  CREATININE 0.78 0.67 0.64 0.47  CALCIUM 8.8*  --  8.2* 7.8*  GLUCOSE 116*  --  103* 114*   Protein Profile:  Recent Labs Lab 05/17/15 1419  ALBUMIN 3.6     Nutrition-Focused Physical Exam Findings:  Unable to complete Nutrition-Focused physical exam at this time.  Pt not feeling well this am   Weight Trend since Admission: Filed Weights   05/17/15 1255 05/17/15 1913  Weight: 140 lb (63.504 kg) 146 lb (66.225 kg)      Diet Order:  Diet regular Room service appropriate?: Yes; Fluid consistency:: Thin  Skin:   reviewed   Height:   Ht Readings from Last 1 Encounters:  05/17/15  (1.702 m)    Weight:   Wt Readings from Last 1 Encounters:  05/17/15 146 lb (66.225 kg)   BMI:  Body  mass index is 22.86 kg/(m^2).  Estimated Nutritional Needs:   Kcal:  BEE 1357 kcals (IF 1.0-1.3, AF 1.3) 8469-6295 kcals/d  Protein:  (1.0-1.2 g/d) 66-79 g/d  Fluid:  (25-15ml/kg) 1650-1992ml/d  EDUCATION NEEDS:   No education needs identified at this time  MODERATE Care Level  Taylar Hartsough B. Freida Busman, RD, LDN [phone] (pager)

## 2015-05-19 NOTE — Progress Notes (Signed)
Patient ID: Morgan Dean, female   DOB: 1976/05/01, 39 y.o.   MRN: [id number] Ordering 44-year-old patient post percutaneous drainage should allow renal abscess 1 day and post stent for calculus removal times x 2 days. Scant drainage from percutaneous tube. Good urine output. Scant drainage means that doesn't have communication collecting system. The only issue keeping her in the hospital right now is that she is having pain. She's not dealing well with her discomfort. Percutaneous tube is in perfect position on her right side. There is no kinking. Plan is to discharge on Ceftin 250 mg twice a day. Patient needs a CAT scan in 10 days. There is resolution of her abscess the percutaneous tube can be withdrawn. I highly suspect resolution of the abscess. The stent can be removed at that time also. We can remove the stent in the office. The present time her white count is 7200 decreased from 9000. Hemoglobin hematocrit are stable creatinine is normal. Patient is lying in bed complaining of every kind of little ache or pain including cold sore fever blister on her lip discomfort in her flank discomfort in her bladder. She is requiring oxycodone graft and now 2 every 4 hours for this pain. Unfortunately will have to send her home with his constipating pain medications and hopefully her pain get better each day. So follow-up is outpatient follow-up in the hopefully she can be discharged by her attending.

## 2015-05-19 NOTE — Progress Notes (Signed)
[facility] [place] [facility]   PATIENT NAME: Morgan Dean    MR#:  [id number]  DATE OF BIRTH:  [birth year]  SUBJECTIVE:  CHIEF COMPLAINT:  Pt is in tears because of suprapubic and right flank pain, not febrile overnight   REVIEW OF SYSTEMS:  CONSTITUTIONAL: Has fever, fatigue  And  weakness.  EYES: No blurred or double vision.  EARS, NOSE, AND THROAT: No tinnitus or ear pain.  RESPIRATORY: No cough, shortness of breath, wheezing or hemoptysis.  CARDIOVASCULAR: No chest pain, orthopnea, edema.  GASTROINTESTINAL: No nausea, vomiting, diarrhea , reporting suprapubic abdominal pain.  GENITOURINARY: No hematuria. Feeling pressure in suprapubic area ENDOCRINE: No polyuria, nocturia,  HEMATOLOGY: No anemia, easy bruising or bleeding SKIN: No rash or lesion. MUSCULOSKELETAL: No joint pain or arthritis.   NEUROLOGIC: No tingling, numbness, weakness.  PSYCHIATRY: No anxiety or depression.   DRUG ALLERGIES:   Allergies  Allergen Reactions  . Tramadol Nausea And Vomiting    VITALS:  Blood pressure 146/84, pulse 79, temperature 98.1 F (36.7 C), temperature source Oral, resp. rate 16, height 5\' 7"  (1.702 m), weight 66.225 kg (146 lb), last menstrual period 04/19/2015, SpO2 99 %.  PHYSICAL EXAMINATION:  GENERAL:  39 y.o.-year-old patient lying in the bed with no acute distress.  EYES: Pupils equal, round, reactive to light and accommodation. No scleral icterus. Extraocular muscles intact.  HEENT: Head atraumatic, normocephalic. Oropharynx and nasopharynx clear.  NECK:  Supple, no jugular venous distention. No thyroid enlargement, no tenderness.  LUNGS: Normal breath sounds bilaterally, no wheezing, rales,rhonchi or crepitation. No use of accessory muscles of respiration.  CARDIOVASCULAR: S1, S2 normal. No murmurs, rubs, or gallops.  ABDOMEN: Soft, suprapubic tenderness, no rebound nondistended. Bowel sounds present. No organomegaly or mass. Rt CVA  tenderness is present EXTREMITIES: No pedal edema, cyanosis, or clubbing.  NEUROLOGIC: Cranial nerves II through XII are intact. Muscle strength 5/5 in all extremities. Sensation intact. Gait not checked.  PSYCHIATRIC: The patient is alert and oriented x 3.  SKIN: No obvious rash, lesion, or ulcer.    LABORATORY PANEL:   CBC  Recent Labs Lab 05/19/15 0338  WBC 7.2  HGB 9.4*  HCT 28.9*  PLT 224   ------------------------------------------------------------------------------------------------------------------  Chemistries   Recent Labs Lab 05/17/15 1419  05/19/15 0338  NA 133*  < > 138  K 3.3*  < > 3.5  CL 96*  < > 109  CO2 27  < > 24  GLUCOSE 116*  < > 114*  BUN 9  < > <5*  CREATININE 0.78  < > 0.47  CALCIUM 8.8*  < > 7.8*  AST 15  --   --   ALT 20  --   --   ALKPHOS 115  --   --   BILITOT 0.2*  --   --   < > = values in this interval not displayed. ------------------------------------------------------------------------------------------------------------------  Cardiac Enzymes No results for input(s): TROPONINI in the last 168 hours. ------------------------------------------------------------------------------------------------------------------  RADIOLOGY:  Ct Abdomen Pelvis W Contrast  05/17/2015   CLINICAL DATA:  Nausea, low back and abdominal pain for 2 weeks, intermittent diarrhea, UTI, on antibiotics, question pyelonephritis or kidney stones, smoker  EXAM: CT ABDOMEN AND PELVIS WITH CONTRAST  TECHNIQUE: Multidetector CT imaging of the abdomen and pelvis was performed using the standard protocol following bolus administration of intravenous contrast. Sagittal and coronal MPR images reconstructed from axial data set.  CONTRAST:  OMNIPAQUE IOHEXOL 300 MG/ML SOLN IV. Dilute oral contrast.  COMPARISON:  None  FINDINGS: Lung bases clear.  RIGHT hydronephrosis and hydroureter secondary to a 5 mm calculus at the RIGHT ureterovesical junction.  Additionally,  abnormal area of attenuation at the inferior pole of the RIGHT kidney, 3.5 x 4.2 x 3.9 cm, containing a thick rim and a central irregular area of low attenuation, compatible with a RIGHT renal abscess.  Adjacent infiltrative/inflammatory changes in perinephric fat at inferior pole RIGHT kidney.  No other focal renal, LEFT ureteral or bladder abnormalities.  Liver, spleen, gallbladder, pancreas, and adrenal glands normal.  Appendix surgically absent by history.  Unremarkable bladder and adnexa.  Stomach and bowel loops unremarkable.  No additional mass, adenopathy, free air, free fluid, or hernia.  Normal sized pericaval nodes noted.  Osseous structures unremarkable.  IMPRESSION: RIGHT hydronephrosis and hydroureter secondary to a 5 mm RIGHT UVJ calculus.  Renal abscess inferior pole RIGHT kidney 3.5 x 4.2 x 3.9 cm with mild adjacent inflammatory changes of RIGHT perinephric fat.  Findings called to Dr. York Cerise on 05/17/2015 at 1610 hours.   Electronically Signed   By: Ulyses Southward M.D.   On: 05/17/2015 16:14   Ct Image Guided Drainage By Percutaneous Catheter  05/18/2015   CLINICAL DATA:  39 year old female with a history of right-sided renal abscess.  EXAM: CT GUIDED DRAINAGE OF RIGHT KIDNEY ABSCESS  ANESTHESIA/SEDATION: 2.5 Mg IV Versed 75 mcg IV Fentanyl  Total Moderate Sedation Time:  25 minutes  PROCEDURE: The procedure, risks, benefits, and alternatives were explained to the patient and the patient's family. Questions regarding the procedure were encouraged and answered. The patient understands and consents to the procedure.  The patient is was positioned in the prone position on the CT gantry table and a scout CT was performed.  The right flank was prepped with chlorhexidinein a sterile fashion, and a sterile drape was applied covering the operative field. A sterile gown and sterile gloves were used for the procedure. Local anesthesia was provided with 1% Lidocaine.  Once the patient is prepped and draped  in the usual sterile fashion, the skin and subcutaneous tissues were generously infiltrated with 1% lidocaine for local anesthesia. A small stab incision was made with 11 blade scalpel common using CT guidance, a trocar needle was advanced into the region of the abscess at the right kidney inferior pole. Once the tip of the needle was confirmed with CT imaging, the stylet was removed and a 035 wire was advanced into the collection. The needle was removed, serial dilation was performed within the soft tissues, and a 10 French drain was placed over the wire.  Drain position was confirmed after removal of the wire with aspiration of purulent material and a final CT images.  The pigtail catheter was locked and sutured in position at the skin.  A sample of approximately 12 cc of purulent fluid was aspirated and sent to the lab.  Patient tolerated the procedure well and remained hemodynamically stable throughout.  No complications were encountered and no significant blood loss was encountered.  COMPLICATIONS: None  FINDINGS: Noncontrast CT image demonstrates edematous changes of the right kidney with a contour abnormality, compatible with the known abscess.  Final image during the case demonstrate 10 French pigtail catheter in the inferior pole of the right kidney, in the region of the abscess.  A sample of 12 cc purulent material sent to the lab for analysis.  IMPRESSION: Status post CT-guided drainage of right kidney abscess. Sample was sent to the lab for analysis.  Signed,  Yvone Neu. Loreta Ave, DO  [facility]  [facility]  PLAN: The drain should be maintained for at least a week to 10 days. This was discussed with Dr. Edwyna Shell of urology team, as well as the patient and the patient's family.  Repeat CT imaging may be performed as an outpatient to determine resolution of the fluid. At that time the drain may be removed.   Electronically Signed   By: Gilmer Mor D.O.   On:  05/18/2015 16:22    EKG:   Orders placed or performed in visit on 08/26/14  . EKG 12-Lead    ASSESSMENT AND PLAN:   Visit 39 year old female with a history of kidney stones and tobacco abuse who presented with nausea and back pain and found to have right hydronephrosis and hydroureter secondary to a 5 mm right UVJ calculus.   Sepsis with rt renal abscess- febrile and tachycardic S/p CT guided percutaneius drain placement by IR 8/11 Saline lock IVF Blood cx ntd abx changed to Ceftin twice a day from zosyn and vanc as recommended by urology urology is recommending a repeat CAT scan in 10 days after discharge Wean off pain medications  1. Ureter stone right side with hydronephrosis and hydroureter:  Urology placed stent 8/10   2. Tobacco dependence: Patient is encouraged sepsis. Patient was counseled for 3 minutes. Patient does want nicotine patch.  3. Sinus tachycardia: This is secondary to pain and anxiety. Patient will be placed on telemetry further monitoring.  4. Hypokalemia: Potassium will be repleted and rechecked prn       All the records are reviewed and case discussed with Care Management/Social Workerr. Management plans discussed with the patient, family and they are in agreement.  CODE STATUS: full  TOTAL TIME TAKING CARE OF THIS PATIENT: 35 minutes.   POSSIBLE D/C IN a.m. DAYS, DEPENDING ON CLINICAL CONDITION.   Ramonita Lab M.D on 05/19/2015 at 3:41 PM  Between 7am to 6pm - Pager - [phone] After 6pm go to [email or web address] - password [email or web address] [facility]  [facility]  Office  [phone]  CC: Primary care physician; No PCP Per Patient

## 2015-05-20 LAB — BASIC METABOLIC PANEL
Anion gap: 7 (ref 5–15)
CALCIUM: 8.2 mg/dL — AB (ref 8.9–10.3)
CO2: 26 mmol/L (ref 22–32)
Chloride: 105 mmol/L (ref 101–111)
Creatinine, Ser: 0.6 mg/dL (ref 0.44–1.00)
GFR calc non Af Amer: >60 mL/min (ref >60)
GLUCOSE: 113 mg/dL — AB (ref 65–99)
Potassium: 3.6 mmol/L (ref 3.5–5.1)
Sodium: 138 mmol/L (ref 135–145)

## 2015-05-20 LAB — CBC
HEMATOCRIT: 28.2 % — AB (ref 35.0–47.0)
Hemoglobin: 9.4 g/dL — ABNORMAL LOW (ref 12.0–16.0)
MCH: 27.4 pg (ref 26.0–34.0)
MCHC: 33.2 g/dL (ref 32.0–36.0)
MCV: 82.5 fL (ref 80.0–100.0)
PLATELETS: 281 10*3/uL (ref 150–440)
RBC: 3.42 MIL/uL — AB (ref 3.80–5.20)
RDW: 14.7 % — AB (ref 11.5–14.5)
WBC: 8.2 10*3/uL (ref 3.6–11.0)

## 2015-05-20 MED ORDER — ACETAMINOPHEN 325 MG PO TABS: 650.0000 mg | ORAL_TABLET | Freq: Four times a day (QID) | ORAL | Status: DC | PRN

## 2015-05-20 MED ORDER — SACCHAROMYCES BOULARDII 250 MG PO CAPS: 250.0000 mg | ORAL_CAPSULE | Freq: Two times a day (BID) | ORAL | Status: DC

## 2015-05-20 MED ORDER — NICOTINE 21 MG/24HR TD PT24: 21.0000 mg | MEDICATED_PATCH | Freq: Every day | TRANSDERMAL | Status: DC

## 2015-05-20 MED ORDER — CEFUROXIME AXETIL 250 MG PO TABS
250.0000 mg | ORAL_TABLET | Freq: Two times a day (BID) | ORAL | Status: DC
Start: 2015-05-20 — End: 2015-05-27

## 2015-05-20 MED ORDER — SENNOSIDES-DOCUSATE SODIUM 8.6-50 MG PO TABS: 1.0000 | ORAL_TABLET | Freq: Every evening | ORAL | Status: DC | PRN

## 2015-05-20 MED ORDER — OXYCODONE-ACETAMINOPHEN 5-325 MG PO TABS: 1.0000 | ORAL_TABLET | ORAL | Status: DC | PRN

## 2015-05-20 NOTE — Discharge Instructions (Signed)
Ureteral Stent Implantation, Care After Refer to this sheet in the next few weeks. These instructions provide you with information on caring for yourself after your procedure. Your health care provider may also give you more specific instructions. Your treatment has been planned according to current medical practices, but problems sometimes occur. Call your health care provider if you have any problems or questions after your procedure. WHAT TO EXPECT AFTER THE PROCEDURE You should be back to normal activity within 48 hours after the procedure. Nausea and vomiting may occur and are commonly the result of anesthesia. It is common to experience sharp pain in the back or lower abdomen and penis with voiding. This is caused by movement of the ends of the stent with the act of urinating.It usually goes away within minutes after you have stopped urinating. HOME CARE INSTRUCTIONS Make sure to drink plenty of fluids. You may have small amounts of bleeding, causing your urine to be red. This is normal. Certain movements may trigger pain or a feeling that you need to urinate. You may be given medicines to prevent infection or bladder spasms. Be sure to take all medicines as directed. Only take over-the-counter or prescription medicines for pain, discomfort, or fever as directed by your health care provider. Do not take aspirin, as this can make bleeding worse.   REMOVAL OF THE STENT: Your stent will be left in for a few weeks. It is TEMPORARY. The stent will be removed by your health care provider in the office. Be sure to keep all follow-up appointments so your health care provider can check that you are healing properly.  SEEK MEDICAL CARE IF:  You experience increasing pain.  Your pain medicine is not working. SEEK IMMEDIATE MEDICAL CARE IF:  Your urine is dark red or has blood clots.  You are leaking urine (incontinent).  You have a fever, chills, feeling sick to your stomach (nausea), or  vomiting.  Your pain is not relieved by pain medicine.  The end of the stent comes out of the urethra.  You are unable to urinate.  Follow-up with urology in 10 days for a repeat CAT scan and follow-up Follow-up with primary care physician or [facility] in a week Activity as tolerated, may shower  Diet-regular

## 2015-05-20 NOTE — Discharge Summary (Signed)
[facility] NAME: Morgan Dean    MR#:  [id number]  DATE OF BIRTH:  02-28-1976  DATE OF ADMISSION:  05/17/2015 ADMITTING PHYSICIAN: Festus Aloe, MD  DATE OF DISCHARGE: 05/20/2015  3:05 PM  PRIMARY CARE PHYSICIAN: No PCP Per Patient    ADMISSION DIAGNOSIS:  Ureterolithiasis [N20.1] Hydronephrosis of left kidney [N13.30] Hydroureter on left [N13.4] Renal abscess, left [N15.1] Sepsis due to Escherichia coli [Q76.22] Complicated UTI (urinary tract infection) [N39.0]  DISCHARGE DIAGNOSIS:  Active Problems:   Ureteral stone with hydronephrosis   SECONDARY DIAGNOSIS:   Past Medical History  Diagnosis Date  . Migraine   . Kidney stones    sepsis with right renal abscess  HOSPITAL COURSE:   1.Sepsis with rt renal abscess- met criteria with being febrile and tachycardic S/p CT guided percutaneius drain placement by IR 8/11, continue percutaneous drain and follow-up with urology in 10 days Blood cx ntd abx changed to Ceftin twice a day from zosyn and vanc as recommended by urology as patient's clinical situation is improved urology is recommending a repeat CAT scan in 10 days after discharge Weaned off pain medications with Percocet  1. Ureter stone right side with hydronephrosis and hydroureter:  Urology placed stent 8/10   2. Tobacco dependence: Patient is encouraged sepsis. Patient was counseled for 3 minutes. Patient does want nicotine patch.  3. Sinus tachycardia: This is secondary to pain and anxiety. Patient will be placed on telemetry further monitoring.  4. Hypokalemia: Potassium will be repleted and rechecked prn   DISCHARGE CONDITIONS:   Satisfactory  CONSULTS OBTAINED:  Treatment Team:  Nicholes Mango, MD   PROCEDURES right ureteral stent Percutaneous drain placement for right renal abscess  DRUG ALLERGIES:   Allergies  Allergen Reactions  . Tramadol Nausea And Vomiting     DISCHARGE MEDICATIONS:   Discharge Medication List as of 05/20/2015 12:55 PM    START taking these medications   Details  acetaminophen (TYLENOL) 325 MG tablet Take 2 tablets (650 mg total) by mouth every 6 (six) hours as needed for mild pain (or Fever >/= 101)., Starting 05/20/2015, Until Discontinued, OTC    cefUROXime (CEFTIN) 250 MG tablet Take 1 tablet (250 mg total) by mouth 2 (two) times daily with a meal., Starting 05/20/2015, Until Discontinued, Print    nicotine (NICODERM CQ - DOSED IN MG/24 HOURS) 21 mg/24hr patch Place 1 patch (21 mg total) onto the skin daily., Starting 05/20/2015, Until Discontinued, Normal    oxyCODONE-acetaminophen (PERCOCET/ROXICET) 5-325 MG per tablet Take 1-2 tablets by mouth every 4 (four) hours as needed for moderate pain (moderate pain)., Starting 05/20/2015, Until Discontinued, Print    saccharomyces boulardii (FLORASTOR) 250 MG capsule Take 1 capsule (250 mg total) by mouth 2 (two) times daily., Starting 05/20/2015, Until Discontinued, Normal    senna-docusate (SENOKOT-S) 8.6-50 MG per tablet Take 1 tablet by mouth at bedtime as needed for mild constipation., Starting 05/20/2015, Until Discontinued, Print      CONTINUE these medications which have NOT CHANGED   Details  butalbital-acetaminophen-caffeine (FIORICET) 50-325-40 MG per tablet Take 1-2 tablets by mouth every 6 (six) hours as needed for headache., Starting 04/24/2015, Until Tue 04/23/16, Print    ketorolac (TORADOL) 10 MG tablet Take 1 tablet (10 mg total) by mouth every 8 (eight) hours., Starting 05/12/2015, Until Discontinued, Print      STOP taking these medications     ciprofloxacin (CIPRO) 500 MG tablet  cephALEXin (KEFLEX) 500 MG capsule      HYDROcodone-acetaminophen (NORCO) 5-325 MG per tablet          DISCHARGE INSTRUCTIONS:   Follow-up with urology in 10 days Follow-up with primary care physician in a week  DIET:  Regular diet  DISCHARGE CONDITION:   Fair  ACTIVITY:  Activity as tolerated, may shower as needed  OXYGEN:  Home Oxygen: No.   Oxygen Delivery: room air  DISCHARGE LOCATION:  home   If you experience worsening of your admission symptoms, develop shortness of breath, life threatening emergency, suicidal or homicidal thoughts you must seek medical attention immediately by calling 911 or calling your MD immediately  if symptoms less severe.  You Must read complete instructions/literature along with all the possible adverse reactions/side effects for all the Medicines you take and that have been prescribed to you. Take any new Medicines after you have completely understood and accpet all the possible adverse reactions/side effects.   Please note  You were cared for by a hospitalist during your hospital stay. If you have any questions about your discharge medications or the care you received while you were in the hospital after you are discharged, you can call the unit and asked to speak with the hospitalist on call if the hospitalist that took care of you is not available. Once you are discharged, your primary care physician will handle any further medical issues. Please note that NO REFILLS for any discharge medications will be authorized once you are discharged, as it is imperative that you return to your primary care physician (or establish a relationship with a primary care physician if you do not have one) for your aftercare needs so that they can reassess your need for medications and monitor your lab values.     Today  Chief Complaint  Patient presents with  . Urinary Tract Infection  . Emesis   Patient is out of bed and ambulating in her room. Pain is manageable with Percocet. Feeling fine. Afebrile for the past 24 hours Okay to discharge the patient home from urology standpoint  ROS:  CONSTITUTIONAL: Denies fevers, chills. Denies any fatigue, weakness.  EYES: Denies blurry vision, double vision, eye  pain. EARS, NOSE, THROAT: Denies tinnitus, ear pain, hearing loss. RESPIRATORY: Denies cough, wheeze, shortness of breath.  CARDIOVASCULAR: Denies chest pain, palpitations, edema.  GASTROINTESTINAL: Denies nausea, vomiting, diarrhea, abdominal pain. Denies bright red blood per rectum. GENITOURINARY: Denies dysuria, hematuria. ENDOCRINE: Denies nocturia or thyroid problems. HEMATOLOGIC AND LYMPHATIC: Denies easy bruising or bleeding. SKIN: Denies rash or lesion. MUSCULOSKELETAL: Denies pain in neck, back, shoulder, knees, hips or arthritic symptoms.  NEUROLOGIC: Denies paralysis, paresthesias.  PSYCHIATRIC: Denies anxiety or depressive symptoms.   VITAL SIGNS:  Blood pressure 129/89, pulse 74, temperature 98.1 F (36.7 C), temperature source Oral, resp. rate 16, height '5\' 7"'  (1.702 m), weight 66.225 kg (146 lb), last menstrual period 04/19/2015, SpO2 100 %.  I/O:   Intake/Output Summary (Last 24 hours) at 05/20/15 1548 Last data filed at 05/20/15 1427  Gross per 24 hour  Intake   3414 ml  Output 2915.5 ml  Net  498.5 ml    PHYSICAL EXAMINATION:  GENERAL:  39 y.o.-year-old patient lying in the bed with no acute distress.  EYES: Pupils equal, round, reactive to light and accommodation. No scleral icterus. Extraocular muscles intact.  HEENT: Head atraumatic, normocephalic. Oropharynx and nasopharynx clear.  NECK:  Supple, no jugular venous distention. No thyroid enlargement, no tenderness.  LUNGS: Normal  breath sounds bilaterally, no wheezing, rales,rhonchi or crepitation. No use of accessory muscles of respiration.  CARDIOVASCULAR: S1, S2 normal. No murmurs, rubs, or gallops.  ABDOMEN: Soft, non-tender, non-distended. Bowel sounds present. No organomegaly or mass. Right CVA tenderness is present with intact drain and clear dressing EXTREMITIES: No pedal edema, cyanosis, or clubbing.  NEUROLOGIC: Cranial nerves II through XII are intact. Muscle strength 5/5 in all extremities.  Sensation intact. Gait not checked.  PSYCHIATRIC: The patient is alert and oriented x 3.  SKIN: No obvious rash, lesion, or ulcer.   DATA REVIEW:   CBC  Recent Labs Lab 05/20/15 0718  WBC 8.2  HGB 9.4*  HCT 28.2*  PLT 281    Chemistries   Recent Labs Lab 05/17/15 1419  05/20/15 0718  NA 133*  < > 138  K 3.3*  < > 3.6  CL 96*  < > 105  CO2 27  < > 26  GLUCOSE 116*  < > 113*  BUN 9  < > <5*  CREATININE 0.78  < > 0.60  CALCIUM 8.8*  < > 8.2*  AST 15  --   --   ALT 20  --   --   ALKPHOS 115  --   --   BILITOT 0.2*  --   --   < > = values in this interval not displayed.  Cardiac Enzymes No results for input(s): TROPONINI in the last 168 hours.  Microbiology Results  Results for orders placed or performed during the hospital encounter of 05/17/15  Urine culture     Status: None   Collection Time: 05/17/15  2:19 PM  Result Value Ref Range Status   Specimen Description URINE, CLEAN CATCH  Final   Special Requests NONE  Final   Culture NO GROWTH 2 DAYS  Final   Report Status 05/19/2015 FINAL  Final  Culture, blood (routine x 2)     Status: None (Preliminary result)   Collection Time: 05/17/15  4:37 PM  Result Value Ref Range Status   Specimen Description BLOOD  Final   Special Requests BOTTLES DRAWN AEROBIC AND ANAEROBIC 5ML  Final   Culture NO GROWTH 1 DAY  Final   Report Status PENDING  Incomplete  Culture, blood (routine x 2)     Status: None (Preliminary result)   Collection Time: 05/17/15  4:42 PM  Result Value Ref Range Status   Specimen Description BLOOD RIGHT ARM  Final   Special Requests BOTTLES DRAWN AEROBIC AND ANAEROBIC 5ML  Final   Culture NO GROWTH 1 DAY  Final   Report Status PENDING  Incomplete  Culture, blood (routine x 2)     Status: None (Preliminary result)   Collection Time: 05/18/15  5:50 PM  Result Value Ref Range Status   Specimen Description BLOOD  Final   Special Requests   Final    BOTTLES DRAWN AEROBIC AND ANAEROBIC 2CC AEROBIC,  3CC ANAEROBIC   Culture NO GROWTH < 12 HOURS  Final   Report Status PENDING  Incomplete  Culture, blood (routine x 2)     Status: None (Preliminary result)   Collection Time: 05/18/15  6:01 PM  Result Value Ref Range Status   Specimen Description BLOOD  Final   Special Requests BOTTLES DRAWN AEROBIC AND ANAEROBIC 2CC  Final   Culture NO GROWTH < 12 HOURS  Final   Report Status PENDING  Incomplete    RADIOLOGY:  Ct Abdomen Pelvis W Contrast  05/17/2015   CLINICAL DATA:  Nausea, low back and abdominal pain for 2 weeks, intermittent diarrhea, UTI, on antibiotics, question pyelonephritis or kidney stones, smoker  EXAM: CT ABDOMEN AND PELVIS WITH CONTRAST  TECHNIQUE: Multidetector CT imaging of the abdomen and pelvis was performed using the standard protocol following bolus administration of intravenous contrast. Sagittal and coronal MPR images reconstructed from axial data set.  CONTRAST:  132m OMNIPAQUE IOHEXOL 300 MG/ML SOLN IV. Dilute oral contrast.  COMPARISON:  None  FINDINGS: Lung bases clear.  RIGHT hydronephrosis and hydroureter secondary to a 5 mm calculus at the RIGHT ureterovesical junction.  Additionally, abnormal area of attenuation at the inferior pole of the RIGHT kidney, 3.5 x 4.2 x 3.9 cm, containing a thick rim and a central irregular area of low attenuation, compatible with a RIGHT renal abscess.  Adjacent infiltrative/inflammatory changes in perinephric fat at inferior pole RIGHT kidney.  No other focal renal, LEFT ureteral or bladder abnormalities.  Liver, spleen, gallbladder, pancreas, and adrenal glands normal.  Appendix surgically absent by history.  Unremarkable bladder and adnexa.  Stomach and bowel loops unremarkable.  No additional mass, adenopathy, free air, free fluid, or hernia.  Normal sized pericaval nodes noted.  Osseous structures unremarkable.  IMPRESSION: RIGHT hydronephrosis and hydroureter secondary to a 5 mm RIGHT UVJ calculus.  Renal abscess inferior pole RIGHT  kidney 3.5 x 4.2 x 3.9 cm with mild adjacent inflammatory changes of RIGHT perinephric fat.  Findings called to Dr. FKarma Greaseron 05/17/2015 at 1610 hours.   Electronically Signed   By: MLavonia DanaM.D.   On: 05/17/2015 16:14   Ct Image Guided Drainage By Percutaneous Catheter  05/18/2015   CLINICAL DATA:  39year old female with a history of right-sided renal abscess.  EXAM: CT GUIDED DRAINAGE OF RIGHT KIDNEY ABSCESS  ANESTHESIA/SEDATION: 2.5 Mg IV Versed 75 mcg IV Fentanyl  Total Moderate Sedation Time:  25 minutes  PROCEDURE: The procedure, risks, benefits, and alternatives were explained to the patient and the patient's family. Questions regarding the procedure were encouraged and answered. The patient understands and consents to the procedure.  The patient is was positioned in the prone position on the CT gantry table and a scout CT was performed.  The right flank was prepped with chlorhexidinein a sterile fashion, and a sterile drape was applied covering the operative field. A sterile gown and sterile gloves were used for the procedure. Local anesthesia was provided with 1% Lidocaine.  Once the patient is prepped and draped in the usual sterile fashion, the skin and subcutaneous tissues were generously infiltrated with 1% lidocaine for local anesthesia. A small stab incision was made with 11 blade scalpel common using CT guidance, a trocar needle was advanced into the region of the abscess at the right kidney inferior pole. Once the tip of the needle was confirmed with CT imaging, the stylet was removed and a 035 wire was advanced into the collection. The needle was removed, serial dilation was performed within the soft tissues, and a 10 French drain was placed over the wire.  Drain position was confirmed after removal of the wire with aspiration of purulent material and a final CT images.  The pigtail catheter was locked and sutured in position at the skin.  A sample of approximately 12 cc of purulent fluid  was aspirated and sent to the lab.  Patient tolerated the procedure well and remained hemodynamically stable throughout.  No complications were encountered and no significant blood loss was encountered.  COMPLICATIONS: None  FINDINGS: Noncontrast CT image  demonstrates edematous changes of the right kidney with a contour abnormality, compatible with the known abscess.  Final image during the case demonstrate 10 French pigtail catheter in the inferior pole of the right kidney, in the region of the abscess.  A sample of 12 cc purulent material sent to the lab for analysis.  IMPRESSION: Status post CT-guided drainage of right kidney abscess. Sample was sent to the lab for analysis.  Signed,  Dulcy Fanny. Earleen Newport, DO  [facility]  [facility]  PLAN: The drain should be maintained for at least a week to 10 days. This was discussed with Dr. Elnoria Howard of urology team, as well as the patient and the patient's family.  Repeat CT imaging may be performed as an outpatient to determine resolution of the fluid. At that time the drain may be removed.   Electronically Signed   By: Corrie Mckusick D.O.   On: 05/18/2015 16:22    EKG:   Orders placed or performed in visit on 08/26/14  . EKG 12-Lead      Management plans discussed with the patient, family and they are in agreement.  CODE STATUS:     Code Status Orders        Start     Ordered   05/17/15 1917  Full code   Continuous     05/17/15 1916      TOTAL TIME TAKING CARE OF THIS PATIENT: 45 minutes.    '@[facility]' @  on 05/20/2015 at 3:48 PM  Between 7am to 6pm - Pager - [phone]  After 6pm go to [email or web address] - password [email or web address] Operating Room Services  [facility]  Office  [phone]  CC: Primary care physician; No PCP Per Patient

## 2015-05-20 NOTE — Progress Notes (Signed)
Pt's IV and tele were removed. Pt was also given discharge instructions, prescriptions, and follow-up appointment instructions. Pt verbalized understanding and was wheeled downstairs with step daughter for discharge. Pt was also taught how to properly empty JP drain until her next follow-up appointment.

## 2015-05-20 NOTE — Care Management Note (Signed)
Case Management Note  Patient Details  Name: Morgan Dean MRN: [id number] Date of Birth: May 01, 1976  Subjective/Objective:            Spoke with Dr Amado Coe on the phone to clarify her discharge order for Morgan Dean which orders home with home health. Dr Amado Coe states that she has discussed this with the Urologist and that Morgan Dean does not need any home health services. This Clinical research associate requested to Dr Amado Coe  that she please cancel the discharge home with home health order and she verbally agreed to do so.       Action/Plan:   Expected Discharge Date:                  Expected Discharge Plan:     In-House Referral:     Discharge planning Services     Post Acute Care Choice:    Choice offered to:     DME Arranged:    DME Agency:     HH Arranged:    HH Agency:     Status of Service:     Medicare Important Message Given:    Date Medicare IM Given:    Medicare IM give by:    Date Additional Medicare IM Given:    Additional Medicare Important Message give by:     If discussed at Long Length of Stay Meetings, dates discussed:    Additional Comments:  Gwynn Crossley A, RN 05/20/2015, 1:46 PM

## 2015-05-20 NOTE — Care Management Note (Signed)
Case Management Note  Patient Details  Name: Morgan Dean MRN: [id number] Date of Birth: 10-May-1976  Subjective/Objective:             Awaiting a call-back from Dr Amado Coe to clarify order to discharge with home health but did not specify what type of home health services are required.        Action/Plan:   Expected Discharge Date:                  Expected Discharge Plan:     In-House Referral:     Discharge planning Services     Post Acute Care Choice:    Choice offered to:     DME Arranged:    DME Agency:     HH Arranged:    HH Agency:     Status of Service:     Medicare Important Message Given:    Date Medicare IM Given:    Medicare IM give by:    Date Additional Medicare IM Given:    Additional Medicare Important Message give by:     If discussed at Long Length of Stay Meetings, dates discussed:    Additional Comments:  Zadiel Leyh A, RN 05/20/2015, 12:55 PM

## 2015-05-21 NOTE — Anesthesia Postprocedure Evaluation (Signed)
  Anesthesia Post-op Note  Patient: Morgan Dean  Procedure(s) Performed: Procedure(s): CYSTOSCOPY WITH RETROGRADE PYELOGRAM, URETEROSCOPY AND STENT PLACEMENT (Right) URETEROSCOPY WITH HOLMIUM LASER LITHOTRIPSY  Anesthesia type:General  Patient location: PACU  Post pain: Pain level controlled  Post assessment: Post-op Vital signs reviewed, Patient's Cardiovascular Status Stable, Respiratory Function Stable, Patent Airway and No signs of Nausea or vomiting  Post vital signs: Reviewed and stable  Last Vitals:  Filed Vitals:   05/20/15 0805  BP: 129/89  Pulse: 74  Temp: 36.7 C  Resp: 16    Level of consciousness: awake, alert  and patient cooperative  Complications: No apparent anesthesia complications

## 2015-05-22 LAB — STONE ANALYSIS
Ca Oxalate,Dihydrate: 30 %
Ca Oxalate,Monohydr.: 50 %
Ca phos cry stone ql IR: 20 %
STONE WEIGHT KSTONE: 58 mg

## 2015-05-22 LAB — BODY FLUID CULTURE

## 2015-05-23 ENCOUNTER — Telehealth: Payer: Self-pay | Admitting: Urology

## 2015-05-23 DIAGNOSIS — N151 Renal and perinephric abscess: Secondary | ICD-10-CM

## 2015-05-23 LAB — CULTURE, BLOOD (ROUTINE X 2)
CULTURE: NO GROWTH
Culture: NO GROWTH
Culture: NO GROWTH
Culture: NO GROWTH

## 2015-05-23 NOTE — Telephone Encounter (Signed)
Patient needs CT abd w/ contrast prior to f/u Friday to assess for resolution of abscess.   Also plan for cysto/ stent.     Vanna Scotland, MD

## 2015-05-26 ENCOUNTER — Inpatient Hospital Stay
Admission: EM | Admit: 2015-05-26 | Discharge: 2015-05-29 | DRG: 871 | Disposition: A | Discharge status: Home / Self Care | Payer: BLUE CROSS/BLUE SHIELD | Attending: Internal Medicine | Admitting: Internal Medicine

## 2015-05-26 ENCOUNTER — Encounter: Payer: Self-pay | Admitting: Emergency Medicine

## 2015-05-26 DIAGNOSIS — Z8249 Family history of ischemic heart disease and other diseases of the circulatory system: Secondary | ICD-10-CM

## 2015-05-26 DIAGNOSIS — Z9851 Tubal ligation status: Secondary | ICD-10-CM

## 2015-05-26 DIAGNOSIS — Z87442 Personal history of urinary calculi: Secondary | ICD-10-CM

## 2015-05-26 DIAGNOSIS — Z9049 Acquired absence of other specified parts of digestive tract: Secondary | ICD-10-CM | POA: Diagnosis present

## 2015-05-26 DIAGNOSIS — N12 Tubulo-interstitial nephritis, not specified as acute or chronic: Secondary | ICD-10-CM | POA: Diagnosis present

## 2015-05-26 DIAGNOSIS — A419 Sepsis, unspecified organism: Principal | ICD-10-CM | POA: Diagnosis present

## 2015-05-26 DIAGNOSIS — R300 Dysuria: Secondary | ICD-10-CM | POA: Diagnosis present

## 2015-05-26 DIAGNOSIS — K5900 Constipation, unspecified: Secondary | ICD-10-CM | POA: Diagnosis present

## 2015-05-26 DIAGNOSIS — N132 Hydronephrosis with renal and ureteral calculous obstruction: Secondary | ICD-10-CM | POA: Diagnosis present

## 2015-05-26 DIAGNOSIS — N136 Pyonephrosis: Secondary | ICD-10-CM | POA: Diagnosis present

## 2015-05-26 DIAGNOSIS — N151 Renal and perinephric abscess: Secondary | ICD-10-CM

## 2015-05-26 DIAGNOSIS — F17210 Nicotine dependence, cigarettes, uncomplicated: Secondary | ICD-10-CM | POA: Diagnosis present

## 2015-05-26 HISTORY — DX: Renal and perinephric abscess: N15.1

## 2015-05-26 LAB — CBC WITH DIFFERENTIAL/PLATELET
BASOS ABS: 0.1 10*3/uL (ref 0–0.1)
Basophils Relative: 1 %
EOS PCT: 2 %
Eosinophils Absolute: 0.2 10*3/uL (ref 0–0.7)
HEMATOCRIT: 42.1 % (ref 35.0–47.0)
Hemoglobin: 13.6 g/dL (ref 12.0–16.0)
LYMPHS ABS: 4.1 10*3/uL — AB (ref 1.0–3.6)
LYMPHS PCT: 25 %
MCH: 26.2 pg (ref 26.0–34.0)
MCHC: 32.4 g/dL (ref 32.0–36.0)
MCV: 81 fL (ref 80.0–100.0)
MONO ABS: 0.9 10*3/uL (ref 0.2–0.9)
Monocytes Relative: 6 %
NEUTROS ABS: 10.6 10*3/uL — AB (ref 1.4–6.5)
Neutrophils Relative %: 66 %
Platelets: 691 10*3/uL — ABNORMAL HIGH (ref 150–440)
RBC: 5.19 MIL/uL (ref 3.80–5.20)
RDW: 15.3 % — AB (ref 11.5–14.5)
WBC: 16 10*3/uL — ABNORMAL HIGH (ref 3.6–11.0)

## 2015-05-26 LAB — BASIC METABOLIC PANEL
ANION GAP: 10 (ref 5–15)
BUN: 7 mg/dL (ref 6–20)
CO2: 28 mmol/L (ref 22–32)
Calcium: 9.8 mg/dL (ref 8.9–10.3)
Chloride: 99 mmol/L — ABNORMAL LOW (ref 101–111)
Creatinine, Ser: 0.84 mg/dL (ref 0.44–1.00)
GFR calc Af Amer: >60 mL/min (ref >60)
GFR calc non Af Amer: >60 mL/min (ref >60)
Glucose, Bld: 114 mg/dL — ABNORMAL HIGH (ref 65–99)
POTASSIUM: 3.4 mmol/L — AB (ref 3.5–5.1)
Sodium: 137 mmol/L (ref 135–145)

## 2015-05-26 MED ORDER — IOHEXOL 240 MG/ML SOLN
25.0000 mL | Freq: Once | INTRAMUSCULAR | Status: AC | PRN
  Administered 2015-05-27: 25 mL via ORAL

## 2015-05-26 MED ORDER — DEXTROSE 5 % IV SOLN
2.0000 g | Freq: Once | INTRAVENOUS | Status: AC
  Administered 2015-05-27: 2 g via INTRAVENOUS
  Filled 2015-05-26: qty 2

## 2015-05-26 MED ORDER — MORPHINE SULFATE (PF) 4 MG/ML IV SOLN
4.0000 mg | Freq: Once | INTRAVENOUS | Status: AC
  Administered 2015-05-26: 4 mg via INTRAVENOUS
  Filled 2015-05-26: qty 1

## 2015-05-26 MED ORDER — SODIUM CHLORIDE 0.9 % IV BOLUS (SEPSIS)
1000.0000 mL | Freq: Once | INTRAVENOUS | Status: AC
  Administered 2015-05-26: 1000 mL via INTRAVENOUS

## 2015-05-26 MED ORDER — ONDANSETRON HCL 4 MG/2ML IJ SOLN
4.0000 mg | Freq: Once | INTRAMUSCULAR | Status: AC
  Administered 2015-05-26: 4 mg via INTRAVENOUS
  Filled 2015-05-26: qty 2

## 2015-05-26 NOTE — ED Notes (Signed)
Pt. States rt. Kidney stent placement last week due to kidney stone obstruction.  Pt. States this morning painful burning sensation to drainage site.  Pt. Also states painful urination today with red/brown discharge.

## 2015-05-27 ENCOUNTER — Emergency Department: Payer: BLUE CROSS/BLUE SHIELD

## 2015-05-27 DIAGNOSIS — K5900 Constipation, unspecified: Secondary | ICD-10-CM | POA: Diagnosis present

## 2015-05-27 DIAGNOSIS — A419 Sepsis, unspecified organism: Secondary | ICD-10-CM | POA: Diagnosis present

## 2015-05-27 DIAGNOSIS — Z9049 Acquired absence of other specified parts of digestive tract: Secondary | ICD-10-CM | POA: Diagnosis present

## 2015-05-27 DIAGNOSIS — R300 Dysuria: Secondary | ICD-10-CM | POA: Diagnosis present

## 2015-05-27 DIAGNOSIS — N136 Pyonephrosis: Secondary | ICD-10-CM | POA: Diagnosis present

## 2015-05-27 DIAGNOSIS — D72829 Elevated white blood cell count, unspecified: Secondary | ICD-10-CM | POA: Diagnosis not present

## 2015-05-27 DIAGNOSIS — N12 Tubulo-interstitial nephritis, not specified as acute or chronic: Secondary | ICD-10-CM | POA: Diagnosis present

## 2015-05-27 DIAGNOSIS — N201 Calculus of ureter: Secondary | ICD-10-CM

## 2015-05-27 DIAGNOSIS — Z8249 Family history of ischemic heart disease and other diseases of the circulatory system: Secondary | ICD-10-CM | POA: Diagnosis not present

## 2015-05-27 DIAGNOSIS — N151 Renal and perinephric abscess: Secondary | ICD-10-CM | POA: Diagnosis not present

## 2015-05-27 DIAGNOSIS — Z87442 Personal history of urinary calculi: Secondary | ICD-10-CM | POA: Diagnosis not present

## 2015-05-27 DIAGNOSIS — Z9851 Tubal ligation status: Secondary | ICD-10-CM | POA: Diagnosis not present

## 2015-05-27 DIAGNOSIS — R509 Fever, unspecified: Secondary | ICD-10-CM

## 2015-05-27 DIAGNOSIS — F17210 Nicotine dependence, cigarettes, uncomplicated: Secondary | ICD-10-CM | POA: Diagnosis present

## 2015-05-27 LAB — BASIC METABOLIC PANEL
Anion gap: 5 (ref 5–15)
BUN: 6 mg/dL (ref 6–20)
CHLORIDE: 105 mmol/L (ref 101–111)
CO2: 28 mmol/L (ref 22–32)
CREATININE: 0.72 mg/dL (ref 0.44–1.00)
Calcium: 8.6 mg/dL — ABNORMAL LOW (ref 8.9–10.3)
GFR calc non Af Amer: >60 mL/min (ref >60)
Glucose, Bld: 98 mg/dL (ref 65–99)
POTASSIUM: 3.7 mmol/L (ref 3.5–5.1)
SODIUM: 138 mmol/L (ref 135–145)

## 2015-05-27 LAB — LACTIC ACID, PLASMA: LACTIC ACID, VENOUS: 0.8 mmol/L (ref 0.5–2.0)

## 2015-05-27 LAB — URINALYSIS COMPLETE WITH MICROSCOPIC (ARMC ONLY)
BILIRUBIN URINE: NEGATIVE
Glucose, UA: NEGATIVE mg/dL
KETONES UR: NEGATIVE mg/dL
Nitrite: NEGATIVE
PH: 6 (ref 5.0–8.0)
PROTEIN: 100 mg/dL — AB
Specific Gravity, Urine: 1.017 (ref 1.005–1.030)

## 2015-05-27 LAB — CBC
HEMATOCRIT: 34 % — AB (ref 35.0–47.0)
Hemoglobin: 10.9 g/dL — ABNORMAL LOW (ref 12.0–16.0)
MCH: 26 pg (ref 26.0–34.0)
MCHC: 32.2 g/dL (ref 32.0–36.0)
MCV: 80.8 fL (ref 80.0–100.0)
PLATELETS: 570 10*3/uL — AB (ref 150–440)
RBC: 4.21 MIL/uL (ref 3.80–5.20)
RDW: 14.9 % — ABNORMAL HIGH (ref 11.5–14.5)
WBC: 12.5 10*3/uL — AB (ref 3.6–11.0)

## 2015-05-27 LAB — POCT PREGNANCY, URINE: Preg Test, Ur: NEGATIVE

## 2015-05-27 MED ORDER — IOHEXOL 300 MG/ML  SOLN
100.0000 mL | Freq: Once | INTRAMUSCULAR | Status: AC | PRN
  Administered 2015-05-27: 100 mL via INTRAVENOUS

## 2015-05-27 MED ORDER — OXYCODONE HCL 5 MG PO TABS
5.0000 mg | ORAL_TABLET | ORAL | Status: DC | PRN
  Administered 2015-05-27 – 2015-05-28 (×6): 5 mg via ORAL
  Filled 2015-05-27 (×6): qty 1

## 2015-05-27 MED ORDER — SODIUM CHLORIDE 0.9 % IJ SOLN
3.0000 mL | Freq: Two times a day (BID) | INTRAMUSCULAR | Status: DC
  Administered 2015-05-27 – 2015-05-29 (×6): 3 mL via INTRAVENOUS

## 2015-05-27 MED ORDER — ONDANSETRON HCL 4 MG/2ML IJ SOLN: 4.0000 mg | Freq: Four times a day (QID) | INTRAMUSCULAR | Status: DC | PRN

## 2015-05-27 MED ORDER — HYDROMORPHONE HCL 1 MG/ML IJ SOLN
1.0000 mg | Freq: Once | INTRAMUSCULAR | Status: AC
  Administered 2015-05-27: 1 mg via INTRAVENOUS

## 2015-05-27 MED ORDER — PHENAZOPYRIDINE HCL 100 MG PO TABS
200.0000 mg | ORAL_TABLET | Freq: Three times a day (TID) | ORAL | Status: DC | PRN
  Administered 2015-05-29: 200 mg via ORAL
  Filled 2015-05-27: qty 2

## 2015-05-27 MED ORDER — HYDROMORPHONE HCL 1 MG/ML IJ SOLN
INTRAMUSCULAR | Status: AC
  Administered 2015-05-27: 1 mg via INTRAVENOUS
  Filled 2015-05-27: qty 1

## 2015-05-27 MED ORDER — NICOTINE 21 MG/24HR TD PT24
21.0000 mg | MEDICATED_PATCH | Freq: Every day | TRANSDERMAL | Status: DC
  Administered 2015-05-27 – 2015-05-29 (×3): 21 mg via TRANSDERMAL
  Filled 2015-05-27 (×3): qty 1

## 2015-05-27 MED ORDER — SENNOSIDES-DOCUSATE SODIUM 8.6-50 MG PO TABS: 1.0000 | ORAL_TABLET | Freq: Every evening | ORAL | Status: DC | PRN

## 2015-05-27 MED ORDER — SODIUM CHLORIDE 0.9 % IV SOLN
INTRAVENOUS | Status: AC
Start: 2015-05-27 — End: 2015-05-27
  Administered 2015-05-27: 04:00:00 via INTRAVENOUS

## 2015-05-27 MED ORDER — ACETAMINOPHEN 650 MG RE SUPP: 650.0000 mg | Freq: Four times a day (QID) | RECTAL | Status: DC | PRN

## 2015-05-27 MED ORDER — DEXTROSE 5 % IV SOLN
1.0000 g | INTRAVENOUS | Status: DC
  Administered 2015-05-27 – 2015-05-29 (×3): 1 g via INTRAVENOUS
  Filled 2015-05-27 (×4): qty 10

## 2015-05-27 MED ORDER — ENOXAPARIN SODIUM 40 MG/0.4ML ~~LOC~~ SOLN
40.0000 mg | SUBCUTANEOUS | Status: DC
  Administered 2015-05-27 – 2015-05-29 (×3): 40 mg via SUBCUTANEOUS
  Filled 2015-05-27 (×3): qty 0.4

## 2015-05-27 MED ORDER — HYDROMORPHONE HCL 1 MG/ML IJ SOLN
1.0000 mg | INTRAMUSCULAR | Status: DC | PRN
  Administered 2015-05-27 – 2015-05-28 (×9): 1 mg via INTRAVENOUS
  Filled 2015-05-27 (×8): qty 1

## 2015-05-27 MED ORDER — ONDANSETRON HCL 4 MG PO TABS: 4.0000 mg | ORAL_TABLET | Freq: Four times a day (QID) | ORAL | Status: DC | PRN

## 2015-05-27 MED ORDER — ACETAMINOPHEN 325 MG PO TABS
650.0000 mg | ORAL_TABLET | Freq: Four times a day (QID) | ORAL | Status: DC | PRN
  Administered 2015-05-27: 650 mg via ORAL
  Filled 2015-05-27: qty 1
  Filled 2015-05-27: qty 2

## 2015-05-27 NOTE — Assessment & Plan Note (Addendum)
The abscess has been well drained with the perc tube.  Tube removal ordered for Monday to be done by IR.

## 2015-05-27 NOTE — ED Notes (Signed)
Pt back from CT

## 2015-05-27 NOTE — Progress Notes (Signed)
[facility] - [place] at [facility]   PATIENT NAME: Morgan Dean    MR#:  [id number]  DATE OF BIRTH:  19-May-1976  SUBJECTIVE:  Patient is very tearful. Patient is complaining of pain and hematuria  REVIEW OF SYSTEMS:    Review of Systems  Constitutional: Negative for fever, chills and malaise/fatigue.  HENT: Negative for sore throat.   Eyes: Negative for blurred vision.  Respiratory: Negative for cough, hemoptysis, shortness of breath and wheezing.   Cardiovascular: Negative for chest pain, palpitations and leg swelling.  Gastrointestinal: Negative for nausea, vomiting, abdominal pain, diarrhea and blood in stool.  Genitourinary: Positive for dysuria, hematuria and flank pain.  Musculoskeletal: Negative for back pain.  Neurological: Negative for dizziness, tremors and headaches.  Endo/Heme/Allergies: Does not bruise/bleed easily.    Tolerating Diet: Yes      DRUG ALLERGIES:   Allergies  Allergen Reactions  . Tramadol Nausea And Vomiting    VITALS:  Blood pressure 113/81, pulse 65, temperature 97.6 F (36.4 C), temperature source Oral, resp. rate 17, height  (1.702 m), weight 65.499 kg (144 lb 6.4 oz), last menstrual period 05/24/2015, SpO2 99 %.  PHYSICAL EXAMINATION:   Physical Exam  Constitutional: She is oriented to person, place, and time and well-developed, well-nourished, and in no distress. No distress.  HENT:  Head: Normocephalic.  Eyes: No scleral icterus.  Neck: Normal range of motion. Neck supple. No JVD present. No tracheal deviation present.  Cardiovascular: Normal rate, regular rhythm and normal heart sounds.  Exam reveals no gallop and no friction rub.   No murmur heard. Pulmonary/Chest: Effort normal and breath sounds normal. No respiratory distress. She has no wheezes. She has no rales. She exhibits no tenderness.  Abdominal: Soft. Bowel sounds are normal. She exhibits no distension and no mass. There is no  tenderness. There is no rebound and no guarding.  Musculoskeletal: Normal range of motion. She exhibits tenderness. She exhibits no edema.  Right CVAT  Neurological: She is alert and oriented to person, place, and time.  Skin: Skin is warm. No rash noted. No erythema.  Psychiatric: Affect and judgment normal.  teary      LABORATORY PANEL:   CBC  Recent Labs Lab 05/27/15 0423  WBC 12.5*  HGB 10.9*  HCT 34.0*  PLT 570*   ------------------------------------------------------------------------------------------------------------------  Chemistries   Recent Labs Lab 05/27/15 0423  NA 138  K 3.7  CL 105  CO2 28  GLUCOSE 98  BUN 6  CREATININE 0.72  CALCIUM 8.6*   ------------------------------------------------------------------------------------------------------------------  Cardiac Enzymes No results for input(s): TROPONINI in the last 168 hours. ------------------------------------------------------------------------------------------------------------------  RADIOLOGY:  Ct Abdomen Pelvis W Contrast  05/27/2015   CLINICAL DATA:  Acute onset of pain and burning at the site of the patient's right-sided nephrostomy tube. Dysuria, with red/brown discharge. Initial encounter.  EXAM: CT ABDOMEN AND PELVIS WITH CONTRAST  TECHNIQUE: Multidetector CT imaging of the abdomen and pelvis was performed using the standard protocol following bolus administration of intravenous contrast.  CONTRAST:  OMNIPAQUE IOHEXOL 300 MG/ML  SOLN  COMPARISON:  CT of the abdomen and pelvis from 05/17/2015  FINDINGS: The visualized lung bases are clear.  The liver and spleen are unremarkable in appearance. The gallbladder is within normal limits. The pancreas and adrenal glands are unremarkable.  Mild soft tissue inflammation is noted about the inferior pole of the right kidney, near the site of insertion of the nephrostomy tube, with mild stranding tracking along the course of  the proximal right  ureter. This is improved from the prior study, status post drainage of the right inferior pole renal abscess. Given the prior presence of an abscess, the patient's symptoms are thought to reflect underlying pyelonephritis and ureteritis.  No residual abscess is seen at this time. The right ureteral stent is noted in expected position, without evidence of hydronephrosis. The left kidney is unremarkable in appearance. No nonobstructing renal stones are seen. The previously noted distal right ureteral stone is no longer seen.  No free fluid is identified. The small bowel is unremarkable in appearance. The stomach is within normal limits. No acute vascular abnormalities are seen.  The patient is status post appendectomy. The colon is unremarkable in appearance.  The bladder is mildly distended and grossly unremarkable. The uterus is grossly unremarkable in appearance. The ovaries are relatively symmetric. No suspicious adnexal masses are seen. No inguinal lymphadenopathy is seen.  No acute osseous abnormalities are identified.  IMPRESSION: 1. No evidence of recurrent or residual abscess at the inferior pole of the right kidney. Given the prior presence of an abscess, the patient's symptoms are thought to reflect underlying residual pyelonephritis and ureteritis. 2. Mild soft tissue inflammation about the inferior pole of the right kidney, near the site of insertion of the nephrostomy tube, with mild stranding tracking along the course of the proximal right ureter. This is improved from the prior study. 3. Right ureteral stent noted in expected position, without evidence of hydronephrosis. Previously noted distal right ureteral stone is no longer seen.   Electronically Signed   By: Roanna Raider M.D.   On: 05/27/2015 00:55     ASSESSMENT AND PLAN:   39 year old female with history of tobacco dependence who presented with persistent pyelonephritis.  1. Sepsis: This is secondary to persistent pyelonephritis.  Patient presented with leukocytosis and fever. CT of the abdomen and pelvis in the emergency department showed persistent pyelonephritis without reoccurrence of abscess and that urethral stent that is in place. Patient is on ceftriaxone. Patient's recent culture was positive for Escherichia coli sensitive to all antibiotics except for ampicillin.  2. Acute polynephritis: Patient will continue on Rocephin. Patient needs a prolonged IV course of antiemetics. Patient was seen and evaluated by urology. I will follow-up on urine and blood cultures that were taken during this hospitalization.  3. Ureteral stone with hydronephrosis: CT scan does show right ureteral stent in place. Patient continues to have increased bladder pain possibly from recurrent infection versus stent irritation. Patient is scheduled to have the stent removed in the office toward the end of this coming week. Continue pain medications and supportive care.  4. Renal abscess: The abscess has been well drained with the percutaneous tube in place. Tube will be removed on Monday by IR.  5. Tobacco dependence: Patient isn't encouraged to stop smoking. Patient was counseled for 3 minutes.      Management plans discussed with the patient and she is in agreement.  CODE STATUS: Full  TOTAL TIME TAKING CARE OF THIS PATIENT: 30 minutes.     POSSIBLE D/C 3 days, DEPENDING ON CLINICAL CONDITION.   Kylia Grajales M.D on 05/27/2015 at 12:03 PM  Between 7am to 6pm - Pager - [phone] After 6pm go to [email or web address] - password [email or web address] [facility]  [facility]  Office  [phone]  CC: Primary care physician; No PCP Per Patient

## 2015-05-27 NOTE — ED Provider Notes (Signed)
[facility] Emergency Department Provider Note  ____________________________________________  Time seen: Approximately 1120 PM  I have reviewed the triage vital signs and the nursing notes.   HISTORY  Chief Complaint Abdominal Pain    HPI Morgan Dean is a 39 y.o. female with a history of kidney stones with recent right sided stent placement as well as drain placed for abscess who presents today with 1 day of worsening right flank pain as well as suprapubic abdominal pain with bleeding in her urine. She said that she had the stent and drain placed last Wednesday and Thursday respectively. She says since then she has needed a minimal amount of Percocets per day for her pain. She is also been on Ceftin for antibiotic coverage. However, over the past 2 days she has been having worsening of her pain. She also blood in her urine this morning. She also said that she had fever yesterday between 100 and 101.   Past Medical History  Diagnosis Date  . Migraine   . Kidney stones     Patient Active Problem List   Diagnosis Date Noted  . Ureteral stone with hydronephrosis 05/17/2015    Past Surgical History  Procedure Laterality Date  . Tubal ligation    . Appendectomy    . Cesarean section    . Renal artery stent    . Cystoscopy with retrograde pyelogram, ureteroscopy and stent placement Right 05/17/2015    Procedure: CYSTOSCOPY WITH RETROGRADE PYELOGRAM, URETEROSCOPY AND STENT PLACEMENT;  Surgeon: Jerilee Field, MD;  Location: [facility] ORS;  Service: Urology;  Laterality: Right;  . Ureteroscopy with holmium laser lithotripsy  05/17/2015    Procedure: URETEROSCOPY WITH HOLMIUM LASER LITHOTRIPSY;  Surgeon: Jerilee Field, MD;  Location: [facility] ORS;  Service: Urology;;    Current Outpatient Rx  Name  Route  Sig  Dispense  Refill  . acetaminophen (TYLENOL) 325 MG tablet   Oral   Take 2 tablets (650 mg total) by mouth every 6 (six) hours as needed for mild pain  (or Fever >/= 101).           Allergies Tramadol  History reviewed. No pertinent family history.  Social History Social History  Substance Use Topics  . Smoking status: Current Every Day Smoker -- 0.50 packs/day    Types: Cigarettes  . Smokeless tobacco: Never Used  . Alcohol Use: No    Review of Systems Constitutional: As above Eyes: No visual changes. ENT: No sore throat. Cardiovascular: Denies chest pain. Respiratory: Denies shortness of breath. Gastrointestinal:  No nausea, no vomiting.  No diarrhea.  No constipation. Genitourinary: As above  Musculoskeletal: Negative for back pain. Skin: Negative for rash. Neurological: Negative for headaches, focal weakness or numbness.  10-point ROS otherwise negative.  ____________________________________________   PHYSICAL EXAM:  VITAL SIGNS: ED Triage Vitals  Enc Vitals Group     BP 05/26/15 2243 112/98 mmHg     Pulse Rate 05/26/15 2243 97     Resp 05/26/15 2243 20     Temp 05/26/15 2243 99.4 F (37.4 C)     Temp src --      SpO2 05/26/15 2243 98 %     Weight 05/26/15 2243 144 lb (65.318 kg)     Height 05/26/15 2243 5\' 7"  (1.702 m)     Head Cir --      Peak Flow --      Pain Score 05/26/15 2244 10     Pain Loc --  Pain Edu? --      Excl. in GC? --     Constitutional: Alert and oriented. Mild distress Eyes: Conjunctivae are normal. PERRL. EOMI. Head: Atraumatic. Nose: No congestion/rhinnorhea. Mouth/Throat: Mucous membranes are moist.  Oropharynx non-erythematous. Neck: No stridor.   Cardiovascular: Normal rate, regular rhythm. Grossly normal heart sounds.  Good peripheral circulation. Respiratory: Normal respiratory effort.  No retractions. Lungs CTAB. Gastrointestinal: Soft with tenderness over the suprapubic area. Right sided drain in placed with insertion site of the right flank posteriorly. Serosanguineous fluid that is hazy in the drain. No distention. No abdominal bruits. No CVA  tenderness. Musculoskeletal: No lower extremity tenderness nor edema.  No joint effusions. Neurologic:  Normal speech and language. No gross focal neurologic deficits are appreciated. No gait instability. Skin:  Skin is warm, dry and intact. No rash noted. Psychiatric: Mood and affect are normal. Speech and behavior are normal.  ____________________________________________   LABS (all labs ordered are listed, but only abnormal results are displayed)  Labs Reviewed  CBC WITH DIFFERENTIAL/PLATELET - Abnormal; Notable for the following:    WBC 16.0 (*)    RDW 15.3 (*)    Platelets 691 (*)    Neutro Abs 10.6 (*)    Lymphs Abs 4.1 (*)    All other components within normal limits  BASIC METABOLIC PANEL - Abnormal; Notable for the following:    Potassium 3.4 (*)    Chloride 99 (*)    Glucose, Bld 114 (*)    All other components within normal limits  URINE CULTURE  CULTURE, BLOOD (ROUTINE X 2)  CULTURE, BLOOD (ROUTINE X 2)  URINALYSIS COMPLETEWITH MICROSCOPIC ([facility] ONLY)  POC URINE PREG, ED   ____________________________________________  EKG   ____________________________________________  RADIOLOGY  CAT scan without recurrence of the abscess. However, possibly persistent pyelonephritis. ____________________________________________   PROCEDURES    ____________________________________________   INITIAL IMPRESSION / ASSESSMENT AND PLAN / ED COURSE  Pertinent labs & imaging results that were available during my care of the patient were reviewed by me and considered in my medical decision making (see chart for details).  ----------------------------------------- 12:25 AM on 05/27/2015 -----------------------------------------  Discussed the case with Dr. Annabell Howells of urology. Agrees with rescanning the patient. Started on ceftriaxone empirically. Had history of Escherichia coli sepsis. Will likely need to admit for IV antibiotics. Pending CAT scan of the abdomen and  pelvis.  ----------------------------------------- 1:21 AM on 05/27/2015 -----------------------------------------  Patient with pain relieved after morphine and Dilaudid. We'll admit to the hospital and IV antibiotics. Likely persistent infection despite IV antibiotics. Admitting for failure of outpatient antibiotics as well as refractory pain. Admitted to Dr. Anne Hahn of the medicine service. ____________________________________________   FINAL CLINICAL IMPRESSION(S) / ED DIAGNOSES  Persistent pyelonephritis. Failed outpatient antibiotics. Return visit.    Myrna Blazer, MD 05/27/15 [phone]

## 2015-05-27 NOTE — Consult Note (Signed)
Subjective: I was asked to see Morgan Dean in consultation by Dr. Juliene Pina for recurrent pain and fever.  She has a history of a right distal ureteral stone and right renal abscess that was treated with a right perc drain and right ureteroscopic stone extraction with laser and stent placement .  Her culture grew e. Coli sensitive to all but ampicillin and she went out on keflex.   She did well post op until this last weds when she began to have increased right flank pain and bladder pain and the urine which was clear became progressively bloody.  She also had a fever to 101 and her WBC on admission was up to 16. A CT shows that the abscess is well decompressed and the stent is in good position.  There are some inflammatory changes around the right kidney suggestive of pyelonephritis.   She is now admitted and is on ceftriaxone.  Her temp has normalized and her WBC is falling.  ROS:  Review of Systems  Constitutional: Positive for fever and malaise/fatigue.  Gastrointestinal: Positive for abdominal pain (suprapubic).  Genitourinary: Positive for flank pain.  All other systems reviewed and are negative.   Allergies  Allergen Reactions  . Tramadol Nausea And Vomiting    Past Medical History  Diagnosis Date  . Migraine   . Kidney stones   . Renal abscess, right     Past Surgical History  Procedure Laterality Date  . Tubal ligation    . Appendectomy    . Cesarean section    . Renal artery stent    . Cystoscopy with retrograde pyelogram, ureteroscopy and stent placement Right 05/17/2015    Procedure: CYSTOSCOPY WITH RETROGRADE PYELOGRAM, URETEROSCOPY AND STENT PLACEMENT;  Surgeon: Jerilee Field, MD;  Location: [facility] ORS;  Service: Urology;  Laterality: Right;  . Ureteroscopy with holmium laser lithotripsy  05/17/2015    Procedure: URETEROSCOPY WITH HOLMIUM LASER LITHOTRIPSY;  Surgeon: Jerilee Field, MD;  Location: [facility] ORS;  Service: Urology;;    Social History   Social History  .  Marital Status: Single    Spouse Name: N/A  . Number of Children: N/A  . Years of Education: N/A   Occupational History  . Not on file.   Social History Main Topics  . Smoking status: Current Every Day Smoker -- 0.50 packs/day    Types: Cigarettes  . Smokeless tobacco: Never Used  . Alcohol Use: No  . Drug Use: No  . Sexual Activity: Yes   Other Topics Concern  . Not on file   Social History Narrative    Family History  Problem Relation Age of Onset  . CAD    . Hypertension      Anti-infectives: Anti-infectives    Start     Dose/Rate Route Frequency Ordered Stop   05/27/15 1000  cefTRIAXone (ROCEPHIN) 1 g in dextrose 5 % 50 mL IVPB     1 g 100 mL/hr over 30 Minutes Intravenous Every 24 hours 05/27/15 0814     05/27/15 0000  cefTRIAXone (ROCEPHIN) 2 g in dextrose 5 % 50 mL IVPB     2 g 100 mL/hr over 30 Minutes Intravenous  Once 05/26/15 2346 05/27/15 0111      Current Facility-Administered Medications  Medication Dose Route Frequency Provider Last Rate Last Dose  . 0.9 %  sodium chloride infusion   Intravenous Continuous Oralia Manis, MD 50 mL/hr at 05/27/15 0340    . acetaminophen (TYLENOL) tablet 650 mg  650 mg Oral  Q6H PRN Oralia Manis, MD   650 mg at 05/27/15 0355   Or  . acetaminophen (TYLENOL) suppository 650 mg  650 mg Rectal Q6H PRN Oralia Manis, MD      . cefTRIAXone (ROCEPHIN) 1 g in dextrose 5 % 50 mL IVPB  1 g Intravenous Q24H Sital Mody, MD      . enoxaparin (LOVENOX) injection 40 mg  40 mg Subcutaneous Q24H Oralia Manis, MD   40 mg at 05/27/15 0342  . HYDROmorphone (DILAUDID) injection 1 mg  1 mg Intravenous Q3H PRN Oralia Manis, MD   1 mg at 05/27/15 0910  . ondansetron (ZOFRAN) tablet 4 mg  4 mg Oral Q6H PRN Oralia Manis, MD       Or  . ondansetron [facility]) injection 4 mg  4 mg Intravenous Q6H PRN Oralia Manis, MD      . oxyCODONE (Oxy IR/ROXICODONE) immediate release tablet 5 mg  5 mg Oral Q4H PRN Oralia Manis, MD   5 mg at 05/27/15 1610  .  senna-docusate (Senokot-S) tablet 1 tablet  1 tablet Oral QHS PRN Oralia Manis, MD      . sodium chloride 0.9 % injection 3 mL  3 mL Intravenous Q12H Oralia Manis, MD   3 mL at 05/27/15 1000   I have reviewed her past, social and family history.  Objective: Vital signs in last 24 hours: Temp:  [97.6 F (36.4 C)-99.4 F (37.4 C)] 97.6 F (36.4 C) (08/20 0814) Pulse Rate:  [65-97] 65 (08/20 0814) Resp:  [16-20] 17 (08/20 0814) BP: (112-128)/(76-98) 113/81 mmHg (08/20 0814) SpO2:  [97 %-99 %] 99 % (08/20 0814) Weight:  [65.318 kg (144 lb)-65.499 kg (144 lb 6.4 oz)] 65.499 kg (144 lb 6.4 oz) (08/20 0322)  Intake/Output from previous day: 08/19 0701 - 08/20 0700 In: 150 [I.V.:150] Out: 200 [Urine:200] Intake/Output this shift:     Physical Exam  Constitutional: She is oriented to person, place, and time and well-developed, well-nourished, and in no distress.  But tearful  HENT:  Head: Normocephalic and atraumatic.  Neck: Normal range of motion. Neck supple.  Cardiovascular: Normal rate, regular rhythm and normal heart sounds.   Pulmonary/Chest: Effort normal and breath sounds normal. No respiratory distress. She has no wheezes.  Abdominal: Soft. Bowel sounds are normal. She exhibits no mass. There is tenderness (right upper quadrant and flank.  suprapubic.). There is no rebound and no guarding.  Musculoskeletal: Normal range of motion. She exhibits no edema or tenderness.  Neurological: She is alert and oriented to person, place, and time.  Skin: Skin is warm and dry. No erythema.  Psychiatric: Affect normal.  tearful    Lab Results:   Recent Labs  05/26/15 2253 05/27/15 0423  WBC 16.0* 12.5*  HGB 13.6 10.9*  HCT 42.1 34.0*  PLT 691* 570*   BMET  Recent Labs  05/26/15 2253 05/27/15 0423  NA 137 138  K 3.4* 3.7  CL 99* 105  CO2 28 28  GLUCOSE 114* 98  BUN 7 6  CREATININE 0.84 0.72  CALCIUM 9.8 8.6*   PT/INR No results for input(s): LABPROT, INR in the  last 72 hours. ABG No results for input(s): PHART, HCO3 in the last 72 hours.  Invalid input(s): PCO2, PO2  Studies/Results: Ct Abdomen Pelvis W Contrast  05/27/2015   CLINICAL DATA:  Acute onset of pain and burning at the site of the patient's right-sided nephrostomy tube. Dysuria, with red/brown discharge. Initial encounter.  EXAM: CT ABDOMEN AND PELVIS WITH  CONTRAST  TECHNIQUE: Multidetector CT imaging of the abdomen and pelvis was performed using the standard protocol following bolus administration of intravenous contrast.  CONTRAST:  OMNIPAQUE IOHEXOL 300 MG/ML  SOLN  COMPARISON:  CT of the abdomen and pelvis from 05/17/2015  FINDINGS: The visualized lung bases are clear.  The liver and spleen are unremarkable in appearance. The gallbladder is within normal limits. The pancreas and adrenal glands are unremarkable.  Mild soft tissue inflammation is noted about the inferior pole of the right kidney, near the site of insertion of the nephrostomy tube, with mild stranding tracking along the course of the proximal right ureter. This is improved from the prior study, status post drainage of the right inferior pole renal abscess. Given the prior presence of an abscess, the patient's symptoms are thought to reflect underlying pyelonephritis and ureteritis.  No residual abscess is seen at this time. The right ureteral stent is noted in expected position, without evidence of hydronephrosis. The left kidney is unremarkable in appearance. No nonobstructing renal stones are seen. The previously noted distal right ureteral stone is no longer seen.  No free fluid is identified. The small bowel is unremarkable in appearance. The stomach is within normal limits. No acute vascular abnormalities are seen.  The patient is status post appendectomy. The colon is unremarkable in appearance.  The bladder is mildly distended and grossly unremarkable. The uterus is grossly unremarkable in appearance. The ovaries are  relatively symmetric. No suspicious adnexal masses are seen. No inguinal lymphadenopathy is seen.  No acute osseous abnormalities are identified.  IMPRESSION: 1. No evidence of recurrent or residual abscess at the inferior pole of the right kidney. Given the prior presence of an abscess, the patient's symptoms are thought to reflect underlying residual pyelonephritis and ureteritis. 2. Mild soft tissue inflammation about the inferior pole of the right kidney, near the site of insertion of the nephrostomy tube, with mild stranding tracking along the course of the proximal right ureter. This is improved from the prior study. 3. Right ureteral stent noted in expected position, without evidence of hydronephrosis. Previously noted distal right ureteral stone is no longer seen.   Electronically Signed   By: Roanna Raider M.D.   On: 05/27/2015 00:55   I have reviewed her CT films, labs and hospital notes from this and the prior admission.  Assessment: Renal abscess The abscess has been well drained with the perc tube.  I will order removal of the tube on Monday by IR.   Ureteral stone with hydronephrosis The ureteral stent is in good position but she is having increased bladder pain possible from recurrent infection vs stent irritation.  She is scheduled to have the stent removed in the office toward the end of this coming week.   Pyelonephritis She has a 3 day history of worsening right flank and bladder pain with recurrent fever after improving with initial drainage and stenting.  She had e. Coli that was sensitive to all but ampicillin and was taking keflex.   Continue IV antibiotics pending cultures.      CC: Dr. Adrian Saran     Bjorn Pippin J 05/27/2015 [phone]

## 2015-05-27 NOTE — Assessment & Plan Note (Addendum)
She has been afebrile with a declining WBC.  Continue IV antibiotics pending cultures.

## 2015-05-27 NOTE — H&P (Signed)
[facility] - [place] at [facility]   PATIENT NAME: Morgan Dean    MR#:  [id number]  DATE OF BIRTH:  Feb 05, 1976  DATE OF ADMISSION:  05/26/2015  PRIMARY CARE PHYSICIAN: No PCP Per Patient   REQUESTING/REFERRING PHYSICIAN: Pershing Proud, M.D.  CHIEF COMPLAINT:   Chief Complaint  Patient presents with  . Abdominal Pain    Pt. states have kidney stent placement last week.  Pt. states change in pain status today.    HISTORY OF PRESENT ILLNESS:  Morgan Dean  is a 39 y.o. female who presents with persistent pyelonephritis causing sepsis. Patient was recently admitted here with obstructing renal stone and renal abscess. She had stent placed by urology with removal of stone, as well as drainage of the abscess with JP drain in place. She was sent home on oral antibiotics. For the past 3 days she has been experiencing worse flank pain on the right, intermittent fevers as high as 101F, and nausea without overt vomiting, as well as increasing dysuria. She came back into the ED for evaluation tonight, was found to have too numerous to count red and white cells in her urine, as well as an elevated white blood cell count again. Hospitalists were called for admission for sepsis due to persistent pyelonephritis.  PAST MEDICAL HISTORY:   Past Medical History  Diagnosis Date  . Migraine   . Kidney stones     PAST SURGICAL HISTORY:   Past Surgical History  Procedure Laterality Date  . Tubal ligation    . Appendectomy    . Cesarean section    . Renal artery stent    . Cystoscopy with retrograde pyelogram, ureteroscopy and stent placement Right 05/17/2015    Procedure: CYSTOSCOPY WITH RETROGRADE PYELOGRAM, URETEROSCOPY AND STENT PLACEMENT;  Surgeon: Jerilee Field, MD;  Location: [facility] ORS;  Service: Urology;  Laterality: Right;  . Ureteroscopy with holmium laser lithotripsy  05/17/2015    Procedure: URETEROSCOPY WITH HOLMIUM LASER LITHOTRIPSY;  Surgeon: Jerilee Field, MD;  Location: [facility] ORS;  Service: Urology;;    SOCIAL HISTORY:   Social History  Substance Use Topics  . Smoking status: Current Every Day Smoker -- 0.50 packs/day    Types: Cigarettes  . Smokeless tobacco: Never Used  . Alcohol Use: No    FAMILY HISTORY:   Family History  Problem Relation Age of Onset  . CAD    . Hypertension      DRUG ALLERGIES:   Allergies  Allergen Reactions  . Tramadol Nausea And Vomiting    MEDICATIONS AT HOME:   Prior to Admission medications   Medication Sig Start Date End Date Taking? Authorizing Provider  acetaminophen (TYLENOL) 325 MG tablet Take 2 tablets (650 mg total) by mouth every 6 (six) hours as needed for mild pain (or Fever >/= 101). 05/20/15  Yes Ramonita Lab, MD  oxyCODONE-acetaminophen (PERCOCET/ROXICET) 5-325 MG per tablet Take 1-2 tablets by mouth every 4 (four) hours. 05/20/15   Historical Provider, MD    REVIEW OF SYSTEMS:  Review of Systems  Constitutional: Positive for fever, chills and malaise/fatigue. Negative for weight loss.  HENT: Negative for ear pain, hearing loss and tinnitus.   Eyes: Negative for blurred vision, double vision, pain and redness.  Respiratory: Negative for cough, hemoptysis and shortness of breath.   Cardiovascular: Negative for chest pain, palpitations, orthopnea and leg swelling.  Gastrointestinal: Positive for vomiting and abdominal pain. Negative for nausea, diarrhea and constipation.  Genitourinary: Positive for flank pain. Negative for dysuria,  frequency and hematuria.  Musculoskeletal: Negative for back pain, joint pain and neck pain.  Skin:       No acne, rash, or lesions  Neurological: Negative for dizziness, tremors, focal weakness and weakness.  Endo/Heme/Allergies: Negative for polydipsia. Does not bruise/bleed easily.  Psychiatric/Behavioral: Negative for depression. The patient is not nervous/anxious and does not have insomnia.      VITAL SIGNS:   Filed Vitals:    05/26/15 2243 05/27/15 0043 05/27/15 0147  BP: 112/98 115/95 116/92  Pulse: 97 95 94  Temp: 99.4 F (37.4 C)    Resp: 20 20 20   Height: 5\' 7"  (1.702 m)    Weight: 65.318 kg (144 lb)    SpO2: 98% 98% 99%   Wt Readings from Last 3 Encounters:  05/26/15 65.318 kg (144 lb)  05/17/15 66.225 kg (146 lb)  05/12/15 63.504 kg (140 lb)    PHYSICAL EXAMINATION:  Physical Exam  Vitals reviewed. Constitutional: She is oriented to person, place, and time. She appears well-developed and well-nourished. No distress.  HENT:  Head: Normocephalic and atraumatic.  Mouth/Throat: Oropharynx is clear and moist.  Eyes: Conjunctivae and EOM are normal. Pupils are equal, round, and reactive to light. No scleral icterus.  Neck: Normal range of motion. Neck supple. No JVD present. No thyromegaly present.  Cardiovascular: Normal rate, regular rhythm and intact distal pulses.  Exam reveals no gallop and no friction rub.   No murmur heard. Respiratory: Effort normal and breath sounds normal. No respiratory distress. She has no wheezes. She has no rales.  GI: Soft. Bowel sounds are normal. She exhibits no distension. There is tenderness (right lower abdomen).  Musculoskeletal: Normal range of motion. She exhibits no edema.  Right flank very tender, JP drain in place. No arthritis, no gout  Lymphadenopathy:    She has no cervical adenopathy.  Neurological: She is alert and oriented to person, place, and time. No cranial nerve deficit.  No dysarthria, no aphasia  Skin: Skin is warm and dry. No rash noted. No erythema.  Psychiatric: She has a normal mood and affect. Her behavior is normal. Judgment and thought content normal.    LABORATORY PANEL:   CBC  Recent Labs Lab 05/26/15 2253  WBC 16.0*  HGB 13.6  HCT 42.1  PLT 691*   ------------------------------------------------------------------------------------------------------------------  Chemistries   Recent Labs Lab 05/26/15 2253  NA 137  K  3.4*  CL 99*  CO2 28  GLUCOSE 114*  BUN 7  CREATININE 0.84  CALCIUM 9.8   ------------------------------------------------------------------------------------------------------------------  Cardiac Enzymes No results for input(s): TROPONINI in the last 168 hours. ------------------------------------------------------------------------------------------------------------------  RADIOLOGY:  Ct Abdomen Pelvis W Contrast  05/27/2015   CLINICAL DATA:  Acute onset of pain and burning at the site of the patient's right-sided nephrostomy tube. Dysuria, with red/brown discharge. Initial encounter.  EXAM: CT ABDOMEN AND PELVIS WITH CONTRAST  TECHNIQUE: Multidetector CT imaging of the abdomen and pelvis was performed using the standard protocol following bolus administration of intravenous contrast.  CONTRAST:  OMNIPAQUE IOHEXOL 300 MG/ML  SOLN  COMPARISON:  CT of the abdomen and pelvis from 05/17/2015  FINDINGS: The visualized lung bases are clear.  The liver and spleen are unremarkable in appearance. The gallbladder is within normal limits. The pancreas and adrenal glands are unremarkable.  Mild soft tissue inflammation is noted about the inferior pole of the right kidney, near the site of insertion of the nephrostomy tube, with mild stranding tracking along the course of the proximal right ureter.  This is improved from the prior study, status post drainage of the right inferior pole renal abscess. Given the prior presence of an abscess, the patient's symptoms are thought to reflect underlying pyelonephritis and ureteritis.  No residual abscess is seen at this time. The right ureteral stent is noted in expected position, without evidence of hydronephrosis. The left kidney is unremarkable in appearance. No nonobstructing renal stones are seen. The previously noted distal right ureteral stone is no longer seen.  No free fluid is identified. The small bowel is unremarkable in appearance. The stomach is  within normal limits. No acute vascular abnormalities are seen.  The patient is status post appendectomy. The colon is unremarkable in appearance.  The bladder is mildly distended and grossly unremarkable. The uterus is grossly unremarkable in appearance. The ovaries are relatively symmetric. No suspicious adnexal masses are seen. No inguinal lymphadenopathy is seen.  No acute osseous abnormalities are identified.  IMPRESSION: 1. No evidence of recurrent or residual abscess at the inferior pole of the right kidney. Given the prior presence of an abscess, the patient's symptoms are thought to reflect underlying residual pyelonephritis and ureteritis. 2. Mild soft tissue inflammation about the inferior pole of the right kidney, near the site of insertion of the nephrostomy tube, with mild stranding tracking along the course of the proximal right ureter. This is improved from the prior study. 3. Right ureteral stent noted in expected position, without evidence of hydronephrosis. Previously noted distal right ureteral stone is no longer seen.   Electronically Signed   By: Roanna Raider M.D.   On: 05/27/2015 00:55    EKG:   Orders placed or performed in visit on 08/26/14  . EKG 12-Lead    IMPRESSION AND PLAN:  Principal Problem:   Sepsis - most likely due to her persistent pyelonephritis. Seemingly patient has failed her outpatient antibiotics with now increasing white count and recurrent fevers. CT abdomen and pelvis in the ED showed that her abscess has not recurred, but is suggestive of a persistent pyelonephritis. Antibiotics broadened in the ED, patient is hemodynamically stable, lactic acid ordered, urine culture sent as well as blood cultures. Active Problems:   Pyelonephritis - antibodies as above, cultures as above, consult urology.   Ureteral stone with hydronephrosis - CT scan shows right ureteral stent still in place, no recurrence of stone. Urology consult as above   Renal abscess - CT scan  shows no recurrence of renal abscess, nephrostomy tube in place, even so, antibiotic spray and given persistent pyelonephritis, defer to urology's recommendations as above.  All the records are reviewed and case discussed with ED provider. Management plans discussed with the patient and/or family.  DVT PROPHYLAXIS: SubQ lovenox  ADMISSION STATUS: Inpatient  CODE STATUS: Full  TOTAL TIME TAKING CARE OF THIS PATIENT: 45 minutes.    Arbie Reisz FIELDING 05/27/2015, 1:51 AM  [facility]  Office  [phone]  CC: Primary care physician; No PCP Per Patient

## 2015-05-27 NOTE — ED Notes (Signed)
POC Urine result NEGATIVE.

## 2015-05-27 NOTE — Assessment & Plan Note (Addendum)
She continues to have stent related terminal dysuria but has not gotten the pyridium yet.   She is scheduled to have the stent removed in the office toward the end of this coming week.

## 2015-05-27 NOTE — ED Notes (Signed)
Patient poct results were negative.

## 2015-05-27 NOTE — Progress Notes (Addendum)
Pt. Arrived to room via stretcher. Pt scooted from stretcher to bed unassisted. General room orientation given. When and how to use ascoms and call bell systems. Pt. A&O. Running NSR on tele.   ADMISSION SKIN ASSESSMENT: Skin is warm and dry. Fever blister to top lip. JP drain to right flank. No other skin issues noted. On right and left lateral side is a round rash. Rash has been circled and marked with date and time to follow progression. Will continue to monitor pt.  Verifying RN, KT.

## 2015-05-27 NOTE — ED Notes (Signed)
Patient transported to CT 

## 2015-05-28 LAB — CBC
HCT: 35.5 % (ref 35.0–47.0)
Hemoglobin: 11.4 g/dL — ABNORMAL LOW (ref 12.0–16.0)
MCH: 26.1 pg (ref 26.0–34.0)
MCHC: 32.2 g/dL (ref 32.0–36.0)
MCV: 81.2 fL (ref 80.0–100.0)
PLATELETS: 508 10*3/uL — AB (ref 150–440)
RBC: 4.38 MIL/uL (ref 3.80–5.20)
RDW: 15.2 % — AB (ref 11.5–14.5)
WBC: 7.8 10*3/uL (ref 3.6–11.0)

## 2015-05-28 LAB — BASIC METABOLIC PANEL
ANION GAP: 8 (ref 5–15)
BUN: 7 mg/dL (ref 6–20)
CALCIUM: 9.1 mg/dL (ref 8.9–10.3)
CO2: 29 mmol/L (ref 22–32)
Chloride: 101 mmol/L (ref 101–111)
Creatinine, Ser: 0.68 mg/dL (ref 0.44–1.00)
GFR calc Af Amer: >60 mL/min (ref >60)
Glucose, Bld: 97 mg/dL (ref 65–99)
POTASSIUM: 4.2 mmol/L (ref 3.5–5.1)
SODIUM: 138 mmol/L (ref 135–145)

## 2015-05-28 MED ORDER — ZOLPIDEM TARTRATE 5 MG PO TABS
5.0000 mg | ORAL_TABLET | Freq: Every evening | ORAL | Status: DC | PRN
  Administered 2015-05-28: 5 mg via ORAL
  Filled 2015-05-28: qty 1

## 2015-05-28 MED ORDER — DOCUSATE SODIUM 100 MG PO CAPS
100.0000 mg | ORAL_CAPSULE | Freq: Two times a day (BID) | ORAL | Status: DC
  Administered 2015-05-28 – 2015-05-29 (×3): 100 mg via ORAL
  Filled 2015-05-28 (×3): qty 1

## 2015-05-28 MED ORDER — HYDROMORPHONE HCL 1 MG/ML IJ SOLN
2.0000 mg | INTRAMUSCULAR | Status: DC | PRN
  Administered 2015-05-28 – 2015-05-29 (×8): 2 mg via INTRAVENOUS
  Filled 2015-05-28 (×8): qty 2

## 2015-05-28 MED ORDER — BISACODYL 10 MG RE SUPP
10.0000 mg | Freq: Every day | RECTAL | Status: DC | PRN
  Administered 2015-05-28: 10 mg via RECTAL
  Filled 2015-05-28: qty 1

## 2015-05-28 MED ORDER — POLYETHYLENE GLYCOL 3350 17 G PO PACK
17.0000 g | PACK | Freq: Every day | ORAL | Status: DC
  Administered 2015-05-28 – 2015-05-29 (×2): 17 g via ORAL
  Filled 2015-05-28 (×2): qty 1

## 2015-05-28 MED ORDER — KETOROLAC TROMETHAMINE 15 MG/ML IJ SOLN
15.0000 mg | Freq: Four times a day (QID) | INTRAMUSCULAR | Status: DC | PRN
  Administered 2015-05-29: 15 mg via INTRAVENOUS
  Filled 2015-05-28: qty 1

## 2015-05-28 NOTE — Progress Notes (Signed)
[facility] - [place] at [facility]   PATIENT NAME: Morgan Dean    MR#:  [id number]  DATE OF BIRTH:  Nov 25, 1975  SUBJECTIVE:  Patient c/o pain adn constipation  REVIEW OF SYSTEMS:    Review of Systems  Constitutional: Negative for fever, chills and malaise/fatigue.  HENT: Negative for sore throat.   Eyes: Negative for blurred vision.  Respiratory: Negative for cough, hemoptysis, shortness of breath and wheezing.   Cardiovascular: Negative for chest pain, palpitations and leg swelling.  Gastrointestinal: Positive for constipation. Negative for nausea, vomiting, abdominal pain, diarrhea and blood in stool.  Genitourinary: Positive for hematuria and flank pain. Negative for dysuria.  Musculoskeletal: Negative for back pain.  Neurological: Negative for dizziness, tremors and headaches.  Endo/Heme/Allergies: Does not bruise/bleed easily.    Tolerating Diet: Yes      DRUG ALLERGIES:   Allergies  Allergen Reactions  . Tramadol Nausea And Vomiting    VITALS:  Blood pressure 132/66, pulse 75, temperature 98.4 F (36.9 C), temperature source Oral, resp. rate 20, height  (1.702 m), weight 65.499 kg (144 lb 6.4 oz), last menstrual period 05/24/2015, SpO2 100 %.  PHYSICAL EXAMINATION:   Physical Exam  Constitutional: She is oriented to person, place, and time and well-developed, well-nourished, and in no distress. No distress.  HENT:  Head: Normocephalic.  Eyes: No scleral icterus.  Neck: Normal range of motion. Neck supple. No JVD present. No tracheal deviation present.  Cardiovascular: Normal rate, regular rhythm and normal heart sounds.  Exam reveals no gallop and no friction rub.   No murmur heard. Pulmonary/Chest: Effort normal and breath sounds normal. No respiratory distress. She has no wheezes. She has no rales. She exhibits no tenderness.  Abdominal: Soft. Bowel sounds are normal. She exhibits no distension and no mass. There is no  tenderness. There is no rebound and no guarding.  Musculoskeletal: Normal range of motion. She exhibits tenderness. She exhibits no edema.  Right CVAT  Neurological: She is alert and oriented to person, place, and time.  Skin: Skin is warm. No rash noted. No erythema.  Psychiatric: Affect and judgment normal.  teary      LABORATORY PANEL:   CBC  Recent Labs Lab 05/28/15 0627  WBC 7.8  HGB 11.4*  HCT 35.5  PLT 508*   ------------------------------------------------------------------------------------------------------------------  Chemistries   Recent Labs Lab 05/28/15 0627  NA 138  K 4.2  CL 101  CO2 29  GLUCOSE 97  BUN 7  CREATININE 0.68  CALCIUM 9.1   ------------------------------------------------------------------------------------------------------------------  Cardiac Enzymes No results for input(s): TROPONINI in the last 168 hours. ------------------------------------------------------------------------------------------------------------------  RADIOLOGY:  Ct Abdomen Pelvis W Contrast  05/27/2015     IMPRESSION: 1. No evidence of recurrent or residual abscess at the inferior pole of the right kidney. Given the prior presence of an abscess, the patient's symptoms are thought to reflect underlying residual pyelonephritis and ureteritis. 2. Mild soft tissue inflammation about the inferior pole of the right kidney, near the site of insertion of the nephrostomy tube, with mild stranding tracking along the course of the proximal right ureter. This is improved from the prior study. 3. Right ureteral stent noted in expected position, without evidence of hydronephrosis. Previously noted distal right ureteral stone is no longer seen.   Electronically Signed   By: Roanna Raider M.D.   On: 05/27/2015 00:55     ASSESSMENT AND PLAN:   39 year old female with history of tobacco dependence who presented with persistent pyelonephritis.  1. Sepsis: This is secondary to  persistent pyelonephritis. Patient presented with leukocytosis and fever. CT of the abdomen and pelvis in the emergency department showed persistent pyelonephritis without reoccurrence of abscess and that urethral stent that is in place. Patient is on ceftriaxone. Patient's recent culture was positive for Escherichia coli sensitive to all antibiotics except for ampicillin.  2. Acute polynephritis: Patient will continue on Rocephin. Patient needs a prolonged IV course of antiemetics. Patient was seen and evaluated by urology. Blood cultures thus far are negative to date. Urine cultures pending. I will increase pain medications for better pain control.   3. Ureteral stone with hydronephrosis: CT scan does show right ureteral stent in place. Patient continues to have increased bladder pain possibly from recurrent infection versus stent irritation. Patient is scheduled to have the stent removed in the office toward the end of this coming week. Continue pain medications and supportive care.  4. Renal abscess: The abscess has been well drained with the percutaneous tube in place. Tube will be removed on Monday by IR.  5. Tobacco dependence: Patient isn't encouraged to stop smoking. Patient was counseled for 3 minutes.   6. Constipation:Start Laxatives.   Management plans discussed with the patient and she is in agreement.  CODE STATUS: Full  TOTAL TIME TAKING CARE OF THIS PATIENT: 25 minutes.     POSSIBLE D/C 2  days, DEPENDING ON CLINICAL CONDITION.   Dene Nazir M.D on 05/28/2015 at 10:02 AM  Between 7am to 6pm - Pager - [phone] After 6pm go to [email or web address] - password [email or web address] [facility]  [place] [facility]  Office  [phone]  CC: Primary care physician; No PCP Per Patient

## 2015-05-28 NOTE — Progress Notes (Signed)
Patient ID: Morgan Dean, female   DOB: 1975-10-26, 39 y.o.   MRN: [id number]    Subjective: She is afebrile but continues to have some stent irritation and reports some pain at the perc tube site.   ROS:  Review of Systems  Gastrointestinal: Positive for constipation. Negative for nausea.  Genitourinary: Positive for flank pain. Negative for hematuria.  All other systems reviewed and are negative.   Anti-infectives: Anti-infectives    Start     Dose/Rate Route Frequency Ordered Stop   05/27/15 1000  cefTRIAXone (ROCEPHIN) 1 g in dextrose 5 % 50 mL IVPB     1 g 100 mL/hr over 30 Minutes Intravenous Every 24 hours 05/27/15 0814     05/27/15 0000  cefTRIAXone (ROCEPHIN) 2 g in dextrose 5 % 50 mL IVPB     2 g 100 mL/hr over 30 Minutes Intravenous  Once 05/26/15 2346 05/27/15 0111      Current Facility-Administered Medications  Medication Dose Route Frequency Provider Last Rate Last Dose  . acetaminophen (TYLENOL) tablet 650 mg  650 mg Oral Q6H PRN Oralia Manis, MD   650 mg at 05/27/15 0355   Or  . acetaminophen (TYLENOL) suppository 650 mg  650 mg Rectal Q6H PRN Oralia Manis, MD      . bisacodyl (DULCOLAX) suppository 10 mg  10 mg Rectal Daily PRN Bjorn Pippin, MD      . cefTRIAXone (ROCEPHIN) 1 g in dextrose 5 % 50 mL IVPB  1 g Intravenous Q24H Adrian Saran, MD   1 g at 05/27/15 1138  . docusate sodium (COLACE) capsule 100 mg  100 mg Oral BID Adrian Saran, MD      . enoxaparin (LOVENOX) injection 40 mg  40 mg Subcutaneous Q24H Oralia Manis, MD   40 mg at 05/28/15 0459  . HYDROmorphone (DILAUDID) injection 2 mg  2 mg Intravenous Q3H PRN Adrian Saran, MD      . ketorolac (TORADOL) 15 MG/ML injection 15 mg  15 mg Intravenous Q6H PRN Sital Mody, MD      . nicotine (NICODERM CQ - dosed in mg/24 hours) patch 21 mg  21 mg Transdermal Daily Sital Mody, MD   21 mg at 05/27/15 1450  . ondansetron (ZOFRAN) tablet 4 mg  4 mg Oral Q6H PRN Oralia Manis, MD       Or  . ondansetron [facility]) injection 4  mg  4 mg Intravenous Q6H PRN Oralia Manis, MD      . oxyCODONE (Oxy IR/ROXICODONE) immediate release tablet 5 mg  5 mg Oral Q4H PRN Oralia Manis, MD   5 mg at 05/28/15 4098  . phenazopyridine (PYRIDIUM) tablet 200 mg  200 mg Oral TID WC PRN Bjorn Pippin, MD      . polyethylene glycol (MIRALAX / GLYCOLAX) packet 17 g  17 g Oral Daily Adrian Saran, MD      . senna-docusate (Senokot-S) tablet 1 tablet  1 tablet Oral QHS PRN Oralia Manis, MD      . sodium chloride 0.9 % injection 3 mL  3 mL Intravenous Q12H Oralia Manis, MD   3 mL at 05/28/15 1000     Objective: Vital signs in last 24 hours: Temp:  [98.1 F (36.7 C)-98.5 F (36.9 C)] 98.4 F (36.9 C) (08/21 0733) Pulse Rate:  [68-78] 75 (08/21 0733) Resp:  [17-20] 20 (08/21 0733) BP: (126-140)/(66-86) 132/66 mmHg (08/21 0733) SpO2:  [98 %-100 %] 100 % (08/21 0733)  Intake/Output from previous day: 08/20 0701 - 08/21  0700 In: 1018 [P.O.:720; I.V.:248; IV Piggyback:50] Out: 2100 [Urine:2100] Intake/Output this shift: Total I/O In: 370 [P.O.:370] Out: 250 [Urine:250]   Physical Exam  Constitutional: She is well-developed, well-nourished, and in no distress.    Lab Results:   Recent Labs  05/27/15 0423 05/28/15 0627  WBC 12.5* 7.8  HGB 10.9* 11.4*  HCT 34.0* 35.5  PLT 570* 508*   BMET  Recent Labs  05/27/15 0423 05/28/15 0627  NA 138 138  K 3.7 4.2  CL 105 101  CO2 28 29  GLUCOSE 98 97  BUN 6 7  CREATININE 0.72 0.68  CALCIUM 8.6* 9.1   PT/INR No results for input(s): LABPROT, INR in the last 72 hours. ABG No results for input(s): PHART, HCO3 in the last 72 hours.  Invalid input(s): PCO2, PO2  Studies/Results: Ct Abdomen Pelvis W Contrast  05/27/2015   CLINICAL DATA:  Acute onset of pain and burning at the site of the patient's right-sided nephrostomy tube. Dysuria, with red/brown discharge. Initial encounter.  EXAM: CT ABDOMEN AND PELVIS WITH CONTRAST  TECHNIQUE: Multidetector CT imaging of the abdomen and  pelvis was performed using the standard protocol following bolus administration of intravenous contrast.  CONTRAST:  OMNIPAQUE IOHEXOL 300 MG/ML  SOLN  COMPARISON:  CT of the abdomen and pelvis from 05/17/2015  FINDINGS: The visualized lung bases are clear.  The liver and spleen are unremarkable in appearance. The gallbladder is within normal limits. The pancreas and adrenal glands are unremarkable.  Mild soft tissue inflammation is noted about the inferior pole of the right kidney, near the site of insertion of the nephrostomy tube, with mild stranding tracking along the course of the proximal right ureter. This is improved from the prior study, status post drainage of the right inferior pole renal abscess. Given the prior presence of an abscess, the patient's symptoms are thought to reflect underlying pyelonephritis and ureteritis.  No residual abscess is seen at this time. The right ureteral stent is noted in expected position, without evidence of hydronephrosis. The left kidney is unremarkable in appearance. No nonobstructing renal stones are seen. The previously noted distal right ureteral stone is no longer seen.  No free fluid is identified. The small bowel is unremarkable in appearance. The stomach is within normal limits. No acute vascular abnormalities are seen.  The patient is status post appendectomy. The colon is unremarkable in appearance.  The bladder is mildly distended and grossly unremarkable. The uterus is grossly unremarkable in appearance. The ovaries are relatively symmetric. No suspicious adnexal masses are seen. No inguinal lymphadenopathy is seen.  No acute osseous abnormalities are identified.  IMPRESSION: 1. No evidence of recurrent or residual abscess at the inferior pole of the right kidney. Given the prior presence of an abscess, the patient's symptoms are thought to reflect underlying residual pyelonephritis and ureteritis. 2. Mild soft tissue inflammation about the inferior pole  of the right kidney, near the site of insertion of the nephrostomy tube, with mild stranding tracking along the course of the proximal right ureter. This is improved from the prior study. 3. Right ureteral stent noted in expected position, without evidence of hydronephrosis. Previously noted distal right ureteral stone is no longer seen.   Electronically Signed   By: Roanna Raider M.D.   On: 05/27/2015 00:55   Labs and notes reviewed.   Assessment and Plan: Renal abscess The abscess has been well drained with the perc tube.  Tube removal ordered for Monday to be done by  IR.   Ureteral stone with hydronephrosis She continues to have stent related terminal dysuria but has not gotten the pyridium yet.   She is scheduled to have the stent removed in the office toward the end of this coming week.   Pyelonephritis She has been afebrile with a declining WBC.  Continue IV antibiotics pending cultures.   I have ordered a dulcolax suppository for the constipation as the stool softeners will take time to work.      LOS: 1 day    Jaquan Sadowsky J 05/28/2015 [phone]

## 2015-05-29 ENCOUNTER — Ambulatory Visit: Payer: Self-pay

## 2015-05-29 DIAGNOSIS — D72829 Elevated white blood cell count, unspecified: Secondary | ICD-10-CM

## 2015-05-29 DIAGNOSIS — N201 Calculus of ureter: Secondary | ICD-10-CM

## 2015-05-29 DIAGNOSIS — N151 Renal and perinephric abscess: Secondary | ICD-10-CM

## 2015-05-29 DIAGNOSIS — R509 Fever, unspecified: Secondary | ICD-10-CM

## 2015-05-29 DIAGNOSIS — N12 Tubulo-interstitial nephritis, not specified as acute or chronic: Secondary | ICD-10-CM

## 2015-05-29 LAB — URINE CULTURE: Culture: 70000

## 2015-05-29 LAB — BASIC METABOLIC PANEL
ANION GAP: 7 (ref 5–15)
BUN: 6 mg/dL (ref 6–20)
CALCIUM: 9.5 mg/dL (ref 8.9–10.3)
CO2: 31 mmol/L (ref 22–32)
Chloride: 99 mmol/L — ABNORMAL LOW (ref 101–111)
Creatinine, Ser: 0.73 mg/dL (ref 0.44–1.00)
GFR calc Af Amer: >60 mL/min (ref >60)
Glucose, Bld: 105 mg/dL — ABNORMAL HIGH (ref 65–99)
POTASSIUM: 4.3 mmol/L (ref 3.5–5.1)
SODIUM: 137 mmol/L (ref 135–145)

## 2015-05-29 MED ORDER — NICOTINE 21 MG/24HR TD PT24: 21.0000 mg | MEDICATED_PATCH | Freq: Every day | TRANSDERMAL | Status: DC

## 2015-05-29 MED ORDER — DOCUSATE SODIUM 100 MG PO CAPS: 100.0000 mg | ORAL_CAPSULE | Freq: Two times a day (BID) | ORAL | Status: DC

## 2015-05-29 MED ORDER — CYCLOBENZAPRINE HCL 10 MG PO TABS
10.0000 mg | ORAL_TABLET | Freq: Once | ORAL | Status: AC
  Administered 2015-05-29: 10 mg via ORAL
  Filled 2015-05-29: qty 1

## 2015-05-29 MED ORDER — OXYCODONE HCL 5 MG PO TABS: 5.0000 mg | ORAL_TABLET | ORAL | Status: DC | PRN

## 2015-05-29 MED ORDER — CIPROFLOXACIN HCL 500 MG PO TABS: 500.0000 mg | ORAL_TABLET | Freq: Two times a day (BID) | ORAL | Status: DC

## 2015-05-29 NOTE — Progress Notes (Signed)
Urology progress note  Patient seen this morning. Significant pain control. Nephrostomy tube to be removed today. Order has been placed. Awaiting interventional radiology remove 2. Patient will follow up in office for stent removal.

## 2015-05-29 NOTE — Discharge Instructions (Signed)
Please contact your physician for any complaints of increasing pain, fever greater than 100.4, nausea/vomiting, or any other complaints.    Please keep your followup appointment scheduled for Friday, August 26.  Take medications as outlined.  This is especially important if you are prescribed antibiotics - take them until they are gone even if you are feeling better.

## 2015-05-29 NOTE — OR Nursing (Signed)
Dr Grace Isaac notified of nephrostomy tube removal request. He will disuss with Dr Mena Goes.

## 2015-05-29 NOTE — Progress Notes (Signed)
  Patient has been well without fevers. Vitals are stable. She complains of right flank pain.  Filed Vitals:   05/29/15 0744  BP: 125/70  Pulse: 76  Temp: 98.6 F (37 C)  Resp: 16     Intake/Output Summary (Last 24 hours) at 05/29/15 0900 Last data filed at 05/29/15 0981  Gross per 24 hour  Intake    770 ml  Output   2135 ml  Net  -1365 ml    Physical exam: Patient watching TV and eating breakfast. In no acute distress.  CBC    Component Value Date/Time   WBC 7.8 05/28/2015 0627   WBC 10.0 08/26/2014 1611   RBC 4.38 05/28/2015 0627   RBC 5.25* 08/26/2014 1611   HGB 11.4* 05/28/2015 0627   HGB 14.7 08/26/2014 1611   HCT 35.5 05/28/2015 0627   HCT 45.3 08/26/2014 1611   PLT 508* 05/28/2015 0627   PLT 365 08/26/2014 1611   MCV 81.2 05/28/2015 0627   MCV 86 08/26/2014 1611   MCH 26.1 05/28/2015 0627   MCH 27.9 08/26/2014 1611   MCHC 32.2 05/28/2015 0627   MCHC 32.4 08/26/2014 1611   RDW 15.2* 05/28/2015 0627   RDW 16.7* 08/26/2014 1611   LYMPHSABS 4.1* 05/26/2015 2253   LYMPHSABS 1.7 06/17/2014 1040   MONOABS 0.9 05/26/2015 2253   MONOABS 0.4 06/17/2014 1040   EOSABS 0.2 05/26/2015 2253   EOSABS 0.1 06/17/2014 1040   BASOSABS 0.1 05/26/2015 2253   BASOSABS 0.1 06/17/2014 1040    BMET    Component Value Date/Time   NA 137 05/29/2015 0732   NA 138 08/26/2014 1611   K 4.3 05/29/2015 0732   K 3.7 08/26/2014 1611   CL 99* 05/29/2015 0732   CL 103 08/26/2014 1611   CO2 31 05/29/2015 0732   CO2 27 08/26/2014 1611   GLUCOSE 105* 05/29/2015 0732   GLUCOSE 100* 08/26/2014 1611   BUN 6 05/29/2015 0732   BUN 9 08/26/2014 1611   CREATININE 0.73 05/29/2015 0732   CREATININE 0.79 08/26/2014 1611   CALCIUM 9.5 05/29/2015 0732   CALCIUM 8.8 08/26/2014 1611   GFRNONAA >60 05/29/2015 0732   GFRNONAA >60 08/26/2014 1611   GFRNONAA >60 06/17/2014 1040   GFRAA >60 05/29/2015 0732   GFRAA >60 08/26/2014 1611   GFRAA >60 06/17/2014 1040    I reviewed her CT images  from admission.  Assessment: Right renal abscess, right distal ureteral stone status post right ureteroscopy with right ureteral stent placement-patient stable. Repeat cultures negative. Some of her pain may be from the stent and not the drain and we discussed it. Cystoscopy stent removal in the office is planned for Friday. Discussed importance of follow-up.  Plan: IR for drain study/pull today. Home this afternoon or in the morning if she remains stable. Follow-up Friday for stent removal. She scheduled for June 02, 2015 at 10 am. [facility].

## 2015-05-29 NOTE — Progress Notes (Signed)
Pt. D/c to home today. IV removal intact. Family at beside to take home. Infection prevention education given. Pt. Educated to call physician with any concerns.

## 2015-05-29 NOTE — Discharge Summary (Signed)
[facility] -  at [facility]   PATIENT NAME: Morgan Dean    MR#:  [id number]  DATE OF BIRTH:  25-Jul-1976  DATE OF ADMISSION:  05/26/2015 ADMITTING PHYSICIAN: Oralia Manis, MD  DATE OF DISCHARGE: 05/29/2015  PRIMARY CARE PHYSICIAN: No PCP Per Patient    ADMISSION DIAGNOSIS:  Pyelonephritis [N12]  DISCHARGE DIAGNOSIS:  Principal Problem:   Sepsis Active Problems:   Ureteral stone with hydronephrosis   Pyelonephritis   Renal abscess   SECONDARY DIAGNOSIS:   Past Medical History  Diagnosis Date  . Migraine   . Kidney stones   . Renal abscess, right     HOSPITAL COURSE:  39 year old female with history of tobacco dependence who presented with persistent pyelonephritis.  1. Sepsis: This is secondary to persistent pyelonephritis. Patient presented with leukocytosis and fever. CT of the abdomen and pelvis in the emergency department showed persistent pyelonephritis without reoccurrence of abscess and that urethral stent that is in place. Patient was on ceftriaxone. Patient's recent culture was positive for Escherichia coli sensitive to all antibiotics except for ampicillin.  2. Acute polynephritis: Patient will continue on CIPRO at discharge.  Patient was seen and evaluated by urology. Blood cultures thus far are negative to date. Urine cultures NTD. I 3. Ureteral stone with hydronephrosis: CT scan does show right ureteral stent in place. Patient continues to have increased bladder pain possibly from recurrent infection versus stent irritation. Patient is scheduled to have the stent removed in the office toward the end of this coming week. Continue pain medications and supportive care.  4. Renal abscess: The abscess has been well drained with the percutaneous tube in place. Tube  removed on Monday by IR.  5. Tobacco dependence: Patient isn't encouraged to stop smoking. Patient was counseled for 3 minutes.   6. Constipation:  resolved  DISCHARGE CONDITIONS AND DIET:  Stable condition Regular diet  CONSULTS OBTAINED:  Treatment Team:  Bjorn Pippin, MD  DRUG ALLERGIES:   Allergies  Allergen Reactions  . Tramadol Nausea And Vomiting    DISCHARGE MEDICATIONS:   Current Discharge Medication List    START taking these medications   Details  ciprofloxacin (CIPRO) 500 MG tablet Take 1 tablet (500 mg total) by mouth 2 (two) times daily. Qty: 14 tablet, Refills: 0    docusate sodium (COLACE) 100 MG capsule Take 1 capsule (100 mg total) by mouth 2 (two) times daily. Qty: 10 capsule, Refills: 0    nicotine (NICODERM CQ - DOSED IN MG/24 HOURS) 21 mg/24hr patch Place 1 patch (21 mg total) onto the skin daily. Qty: 28 patch, Refills: 0    oxyCODONE (OXY IR/ROXICODONE) 5 MG immediate release tablet Take 1 tablet (5 mg total) by mouth every 4 (four) hours as needed for moderate pain. Qty: 30 tablet, Refills: 0      STOP taking these medications     acetaminophen (TYLENOL) 325 MG tablet      oxyCODONE-acetaminophen (PERCOCET/ROXICET) 5-325 MG per tablet               Today   CHIEF COMPLAINT:  Flank pain hematuria improved wants drain removed   VITAL SIGNS:  Blood pressure 125/70, pulse 76, temperature 98.6 F (37 C), temperature source Oral, resp. rate 16, height  (1.702 m), weight 65.499 kg (144 lb 6.4 oz), last menstrual period 05/24/2015, SpO2 96 %.   REVIEW OF SYSTEMS:  Review of Systems  Constitutional: Negative for fever, chills and malaise/fatigue.  HENT: Negative for sore  throat.   Eyes: Negative for blurred vision.  Respiratory: Negative for cough, hemoptysis, shortness of breath and wheezing.   Cardiovascular: Negative for chest pain, palpitations and leg swelling.  Gastrointestinal: Negative for nausea, vomiting, abdominal pain, diarrhea and blood in stool.  Genitourinary: Positive for flank pain. Negative for dysuria.  Musculoskeletal: Positive for back pain.   Neurological: Negative for dizziness, tremors and headaches.  Endo/Heme/Allergies: Does not bruise/bleed easily.     PHYSICAL EXAMINATION:  GENERAL:  39 y.o.-year-old patient lying in the bed with no acute distress.  NECK:  Supple, no jugular venous distention. No thyroid enlargement, no tenderness.  LUNGS: Normal breath sounds bilaterally, no wheezing, rales,rhonchi  No use of accessory muscles of respiration.  CARDIOVASCULAR: S1, S2 normal. No murmurs, rubs, or gallops.  ABDOMEN: Soft, non-tender, non-distended. Bowel sounds present. No organomegaly or mass.  EXTREMITIES: No pedal edema, cyanosis, or clubbing.  PSYCHIATRIC: The patient is alert and oriented x 3.  SKIN: No obvious rash, lesion, or ulcer.  No CVAT JP drain in place DATA REVIEW:   CBC  Recent Labs Lab 05/28/15 0627  WBC 7.8  HGB 11.4*  HCT 35.5  PLT 508*    Chemistries   Recent Labs Lab 05/29/15 0732  NA 137  K 4.3  CL 99*  CO2 31  GLUCOSE 105*  BUN 6  CREATININE 0.73  CALCIUM 9.5    Cardiac Enzymes No results for input(s): TROPONINI in the last 168 hours.  Microbiology Results  @[email or web address] @  RADIOLOGY:  No results found.    Management plans discussed with the patient and she is in agreement. Stable for discharge home  Patient should follow up with UROLOGY FRIDAY  CODE STATUS:     Code Status Orders        Start     Ordered   05/27/15 0312  Full code   Continuous     05/27/15 0311      TOTAL TIME TAKING CARE OF THIS PATIENT: 35 minutes.    Maryalyce Sanjuan M.D on 05/29/2015 at 1:19 PM  Between 7am to 6pm - Pager - [phone] After 6pm go to [email or web address] - password [email or web address] [facility]  [facility]  Office  [phone]  CC: Primary care physician; No PCP Per Patient

## 2015-06-01 LAB — CULTURE, BLOOD (ROUTINE X 2)
CULTURE: NO GROWTH
Culture: NO GROWTH

## 2015-06-02 ENCOUNTER — Encounter: Payer: Self-pay | Admitting: Urology

## 2015-06-02 ENCOUNTER — Ambulatory Visit (INDEPENDENT_AMBULATORY_CARE_PROVIDER_SITE_OTHER): Payer: Self-pay | Admitting: Urology

## 2015-06-02 VITALS — BP 133/87 | HR 105 | Temp 98.8°F | Ht 67.0 in | Wt 143.4 lb

## 2015-06-02 DIAGNOSIS — N132 Hydronephrosis with renal and ureteral calculous obstruction: Secondary | ICD-10-CM

## 2015-06-02 LAB — MICROSCOPIC EXAMINATION: RBC, UA: >30 /hpf — ABNORMAL HIGH (ref 0–?)

## 2015-06-02 LAB — URINALYSIS, COMPLETE
BILIRUBIN UA: NEGATIVE
Glucose, UA: NEGATIVE
KETONES UA: NEGATIVE
Nitrite, UA: NEGATIVE
SPEC GRAV UA: 1.015 (ref 1.005–1.030)
Urobilinogen, Ur: 0.2 mg/dL (ref 0.2–1.0)
pH, UA: 7.5 (ref 5.0–7.5)

## 2015-06-02 MED ORDER — LIDOCAINE HCL 2 % EX GEL
1.0000 "application " | Freq: Once | CUTANEOUS | Status: AC
  Administered 2015-06-02: 1 via URETHRAL

## 2015-06-02 NOTE — Progress Notes (Signed)
06/02/2015 11:20 AM   Morgan Dean 1975-11-15 [id number]  Referring provider: No referring provider defined for this encounter.  Chief Complaint  Patient presents with  . Procedure    follow up hospital cystoscopy/stent removal     HPI:  1 - Right Ureteral Stone - s/p right ureteroscopy with removal of 5mm distal stone 05/2015 by Dr. Mena Goes to stone free. She is s/p BTL and not seeking future pregnancies.  2 - Rt Renal Abscess - Rt lower pole renal abscess 05/2015 at time of obstructing stone. Abscess managed with perc drain and CX-specific ABX. Drain removed after f/u CT verified resolutio of abscess. Also no residual stones.  Today [name] is seen for cysto stent pull. No interval fevers.    PMH: Past Medical History  Diagnosis Date  . Migraine   . Kidney stones   . Renal abscess, right     Surgical History: Past Surgical History  Procedure Laterality Date  . Tubal ligation    . Appendectomy    . Cesarean section    . Renal artery stent    . Cystoscopy with retrograde pyelogram, ureteroscopy and stent placement Right 05/17/2015    Procedure: CYSTOSCOPY WITH RETROGRADE PYELOGRAM, URETEROSCOPY AND STENT PLACEMENT;  Surgeon: Jerilee Field, MD;  Location: [facility] ORS;  Service: Urology;  Laterality: Right;  . Ureteroscopy with holmium laser lithotripsy  05/17/2015    Procedure: URETEROSCOPY WITH HOLMIUM LASER LITHOTRIPSY;  Surgeon: Jerilee Field, MD;  Location: [facility] ORS;  Service: Urology;;    Home Medications:    Medication List       This list is accurate as of: 06/02/15 11:20 AM.  Always use your most recent med list.               ciprofloxacin 500 MG tablet  Commonly known as:  CIPRO  Take 1 tablet (500 mg total) by mouth 2 (two) times daily.     docusate sodium 100 MG capsule  Commonly known as:  COLACE  Take 1 capsule (100 mg total) by mouth 2 (two) times daily.     nicotine 21 mg/24hr patch  Commonly known as:  NICODERM CQ - dosed in  mg/24 hours  Place 1 patch (21 mg total) onto the skin daily.     oxyCODONE 5 MG immediate release tablet  Commonly known as:  Oxy IR/ROXICODONE  Take 1 tablet (5 mg total) by mouth every 4 (four) hours as needed for moderate pain.        Allergies:  Allergies  Allergen Reactions  . Tramadol Nausea And Vomiting    Family History: Family History  Problem Relation Age of Onset  . CAD    . Hypertension    . Kidney failure Neg Hx   . Hematuria Neg Hx   . Sickle cell trait Neg Hx     Social History:  reports that she has been smoking Cigarettes.  She has been smoking about 0.50 packs per day. She has never used smokeless tobacco. She reports that she does not drink alcohol or use illicit drugs.  ROS: UROLOGY Frequent Urination?: Yes Hard to postpone urination?: Yes Burning/pain with urination?: Yes Get up at night to urinate?: Yes Leakage of urine?: Yes Urine stream starts and stops?: Yes Trouble starting stream?: Yes Do you have to strain to urinate?: Yes Blood in urine?: Yes Urinary tract infection?: Yes Sexually transmitted disease?: No Injury to kidneys or bladder?: Yes Painful intercourse?: No Weak stream?: No Currently pregnant?: No Vaginal bleeding?: No  Last menstrual period?: No  Gastrointestinal Nausea?: Yes Vomiting?: No Indigestion/heartburn?: No Diarrhea?: No Constipation?: No  Constitutional Fever: No Night sweats?: No Weight loss?: No Fatigue?: No  Skin Skin rash/lesions?: No Itching?: No  Eyes Blurred vision?: No Double vision?: No  Ears/Nose/Throat Sore throat?: No Sinus problems?: No  Hematologic/Lymphatic Swollen glands?: No Easy bruising?: No  Cardiovascular Leg swelling?: No  Respiratory Cough?: No Shortness of breath?: No  Endocrine Excessive thirst?: No  Musculoskeletal Back pain?: Yes Joint pain?: No  Neurological Headaches?: No Dizziness?: No  Psychologic Depression?: No Anxiety?: No  Physical  Exam: BP 133/87 mmHg  Pulse 105  Temp(Src) 98.8 F (37.1 C) (Oral)  Ht 5\' 7"  (1.702 m)  Wt 143 lb 6.4 oz (65.046 kg)  BMI 22.45 kg/m2  LMP 05/24/2015  Constitutional:  Alert and oriented, No acute distress. HEENT:  AT, moist mucus membranes.  Trachea midline, no masses. Cardiovascular: No clubbing, cyanosis, or edema. Respiratory: Normal respiratory effort, no increased work of breathing. GI: Abdomen is soft, nontender, nondistended, no abdominal masses GU: No CVA tenderness. Prior Rt neph tueb site c/d/i. Skin: No rashes, bruises or suspicious lesions. Lymph: No cervical or inguinal adenopathy. Neurologic: Grossly intact, no focal deficits, moving all 4 extremities. Psychiatric: Normal mood and affect.  PROCEDURE:  Stent pull  Urethra prepped with iodine after placing pt in low-lithotomy position in procedure room. Cystoscopy performed wit h16F flexible scope and distal end of stent grasped with cold graspers and removed. Stent inspected and intact and discarded. Pt tollerated w/o complications. Kellita assisting and chaparone.   Laboratory Data: Lab Results  Component Value Date   WBC 7.8 05/28/2015   HGB 11.4* 05/28/2015   HCT 35.5 05/28/2015   MCV 81.2 05/28/2015   PLT 508* 05/28/2015    Lab Results  Component Value Date   CREATININE 0.73 05/29/2015    No results found for: PSA  No results found for: TESTOSTERONE  No results found for: HGBA1C  Urinalysis    Component Value Date/Time   COLORURINE YELLOW* 05/26/2015 2348   COLORURINE Yellow 06/17/2014 1040   APPEARANCEUR CLOUDY* 05/26/2015 2348   APPEARANCEUR Clear 06/17/2014 1040   LABSPEC 1.017 05/26/2015 2348   LABSPEC 1.016 06/17/2014 1040   PHURINE 6.0 05/26/2015 2348   PHURINE 6.0 06/17/2014 1040   GLUCOSEU NEGATIVE 05/26/2015 2348   GLUCOSEU Negative 06/17/2014 1040   HGBUR 3+* 05/26/2015 2348   HGBUR Negative 06/17/2014 1040   BILIRUBINUR NEGATIVE 05/26/2015 2348   BILIRUBINUR Negative  06/17/2014 1040   KETONESUR NEGATIVE 05/26/2015 2348   KETONESUR Negative 06/17/2014 1040   PROTEINUR 100* 05/26/2015 2348   PROTEINUR Negative 06/17/2014 1040   NITRITE NEGATIVE 05/26/2015 2348   NITRITE Negative 06/17/2014 1040   LEUKOCYTESUR 2+* 05/26/2015 2348   LEUKOCYTESUR 1+ 06/17/2014 1040    Pertinent Imaging: Reviewed as per HPI  Assessment & Plan:     1 - Right Ureteral Stone - now stone free. Rec surveillance x several to verify no recurrence.   2 - Rt Renal Abscess - likley due to stone and obstruction that has now ben treated. Resolved by most recent imaging and all GU hardware now out. I feel she can discontinue any antimicrobials and return to work next week.   3 - RTC 6mos with KUB, Rneal [place], then perhaps annually x several.      No Follow-up on file.  Sebastian Ache, MD  [facility] [address] [place], [place] [address] [phone]

## 2015-06-05 ENCOUNTER — Encounter: Payer: Self-pay | Admitting: Emergency Medicine

## 2015-06-05 ENCOUNTER — Emergency Department: Payer: Self-pay

## 2015-06-05 ENCOUNTER — Emergency Department
Admission: EM | Admit: 2015-06-05 | Discharge: 2015-06-05 | Disposition: A | Discharge status: Home / Self Care | Payer: Self-pay | Attending: Emergency Medicine | Admitting: Emergency Medicine

## 2015-06-05 DIAGNOSIS — R109 Unspecified abdominal pain: Secondary | ICD-10-CM | POA: Insufficient documentation

## 2015-06-05 DIAGNOSIS — Z720 Tobacco use: Secondary | ICD-10-CM | POA: Insufficient documentation

## 2015-06-05 DIAGNOSIS — R509 Fever, unspecified: Secondary | ICD-10-CM | POA: Insufficient documentation

## 2015-06-05 LAB — CBC WITH DIFFERENTIAL/PLATELET
BASOS ABS: 0.3 10*3/uL — AB (ref 0–0.1)
BASOS PCT: 3 %
EOS ABS: 0.1 10*3/uL (ref 0–0.7)
EOS PCT: 1 %
HCT: 41.9 % (ref 35.0–47.0)
HEMOGLOBIN: 13.7 g/dL (ref 12.0–16.0)
LYMPHS ABS: 2.7 10*3/uL (ref 1.0–3.6)
Lymphocytes Relative: 27 %
MCH: 26.1 pg (ref 26.0–34.0)
MCHC: 32.6 g/dL (ref 32.0–36.0)
MCV: 80.1 fL (ref 80.0–100.0)
Monocytes Absolute: 0.5 10*3/uL (ref 0.2–0.9)
Monocytes Relative: 5 %
NEUTROS PCT: 64 %
Neutro Abs: 6.3 10*3/uL (ref 1.4–6.5)
PLATELETS: 470 10*3/uL — AB (ref 150–440)
RBC: 5.23 MIL/uL — AB (ref 3.80–5.20)
RDW: 15.1 % — ABNORMAL HIGH (ref 11.5–14.5)
WBC: 10 10*3/uL (ref 3.6–11.0)

## 2015-06-05 LAB — URINALYSIS COMPLETE WITH MICROSCOPIC (ARMC ONLY)
BILIRUBIN URINE: NEGATIVE
Glucose, UA: NEGATIVE mg/dL
HGB URINE DIPSTICK: NEGATIVE
Ketones, ur: NEGATIVE mg/dL
Leukocytes, UA: NEGATIVE
Nitrite: NEGATIVE
PH: 7 (ref 5.0–8.0)
PROTEIN: 30 mg/dL — AB
Specific Gravity, Urine: 1.017 (ref 1.005–1.030)

## 2015-06-05 LAB — COMPREHENSIVE METABOLIC PANEL
ALBUMIN: 4.6 g/dL (ref 3.5–5.0)
ALK PHOS: 106 U/L (ref 38–126)
ALT: 19 U/L (ref 14–54)
AST: 23 U/L (ref 15–41)
Anion gap: 8 (ref 5–15)
BUN: 8 mg/dL (ref 6–20)
CALCIUM: 9.9 mg/dL (ref 8.9–10.3)
CO2: 27 mmol/L (ref 22–32)
CREATININE: 0.8 mg/dL (ref 0.44–1.00)
Chloride: 102 mmol/L (ref 101–111)
GFR calc non Af Amer: >60 mL/min (ref >60)
GLUCOSE: 117 mg/dL — AB (ref 65–99)
Potassium: 4.1 mmol/L (ref 3.5–5.1)
SODIUM: 137 mmol/L (ref 135–145)
Total Bilirubin: 0.5 mg/dL (ref 0.3–1.2)
Total Protein: 9.1 g/dL — ABNORMAL HIGH (ref 6.5–8.1)

## 2015-06-05 LAB — LIPASE, BLOOD: Lipase: 21 U/L — ABNORMAL LOW (ref 22–51)

## 2015-06-05 MED ORDER — OXYCODONE-ACETAMINOPHEN 5-325 MG PO TABS: 1.0000 | ORAL_TABLET | Freq: Four times a day (QID) | ORAL | Status: DC | PRN

## 2015-06-05 MED ORDER — HYDROMORPHONE HCL 1 MG/ML IJ SOLN
1.0000 mg | Freq: Once | INTRAMUSCULAR | Status: AC
  Administered 2015-06-05: 1 mg via INTRAVENOUS
  Filled 2015-06-05: qty 1

## 2015-06-05 MED ORDER — DIAZEPAM 5 MG PO TABS: 5.0000 mg | ORAL_TABLET | Freq: Three times a day (TID) | ORAL | Status: DC | PRN

## 2015-06-05 MED ORDER — SODIUM CHLORIDE 0.9 % IV SOLN
1000.0000 mL | Freq: Once | INTRAVENOUS | Status: AC
  Administered 2015-06-05: 1000 mL via INTRAVENOUS

## 2015-06-05 MED ORDER — DIAZEPAM 5 MG/ML IJ SOLN
5.0000 mg | Freq: Once | INTRAMUSCULAR | Status: AC
  Administered 2015-06-05: 5 mg via INTRAVENOUS
  Filled 2015-06-05: qty 2

## 2015-06-05 NOTE — Discharge Instructions (Signed)
Flank Pain °Flank pain refers to pain that is located on the side of the body between the upper abdomen and the back. The pain may occur over a short period of time (acute) or may be long-term or reoccurring (chronic). It may be mild or severe. Flank pain can be caused by many things. °CAUSES  °Some of the more common causes of flank pain include: °· Muscle strains.   °· Muscle spasms.   °· A disease of your spine (vertebral disk disease).   °· A lung infection (pneumonia).   °· Fluid around your lungs (pulmonary edema).   °· A kidney infection.   °· Kidney stones.   °· A very painful skin rash caused by the chickenpox virus (shingles).   °· Gallbladder disease.   °HOME CARE INSTRUCTIONS  °Home care will depend on the cause of your pain. In general, °· Rest as directed by your caregiver. °· Drink enough fluids to keep your urine clear or pale yellow. °· Only take over-the-counter or prescription medicines as directed by your caregiver. Some medicines may help relieve the pain. °· Tell your caregiver about any changes in your pain. °· Follow up with your caregiver as directed. °SEEK IMMEDIATE MEDICAL CARE IF:  °· Your pain is not controlled with medicine.   °· You have new or worsening symptoms. °· Your pain increases.   °· You have abdominal pain.   °· You have shortness of breath.   °· You have persistent nausea or vomiting.   °· You have swelling in your abdomen.   °· You feel faint or pass out.   °· You have blood in your urine. °· You have a fever or persistent symptoms for more than 2-3 days. °· You have a fever and your symptoms suddenly get worse. °MAKE SURE YOU:  °· Understand these instructions. °· Will watch your condition. °· Will get help right away if you are not doing well or get worse. °Document Released: 11/14/2005 Document Revised: 06/17/2012 Document Reviewed: 05/07/2012 °ExitCare® Patient Information ©2015 [employer]. This information is not intended to replace advice given to you by your  health care provider. Make sure you discuss any questions you have with your health care provider. ° °

## 2015-06-05 NOTE — ED Notes (Signed)
Pt states she had a kidney stent placed 2 weeks ago and then had tube removed last week, states yesterday she went back to work and started having lower back pain and RLQ abd. Pain with fever, denies any urinary symptoms

## 2015-06-05 NOTE — ED Provider Notes (Signed)
[facility] Emergency Department Provider Note     Time seen: ----------------------------------------- 1:02 PM on 06/05/2015 -----------------------------------------    I have reviewed the triage vital signs and the nursing notes.   HISTORY  Chief Complaint Post-op Problem    HPI Morgan Dean is a 39 y.o. female presents to ER with sudden severe right flank pain. Patient recently with nephrostomy tube and removal. She had previously had a renal abscess, she claims severe jolting like right side pain. States temp at home was 100.3. No other associated symptoms.   Past Medical History  Diagnosis Date  . Migraine   . Kidney stones   . Renal abscess, right     Patient Active Problem List   Diagnosis Date Noted  . Sepsis 05/27/2015  . Pyelonephritis 05/27/2015  . Renal abscess 05/27/2015  . Ureteral stone with hydronephrosis 05/17/2015    Past Surgical History  Procedure Laterality Date  . Tubal ligation    . Appendectomy    . Cesarean section    . Renal artery stent    . Cystoscopy with retrograde pyelogram, ureteroscopy and stent placement Right 05/17/2015    Procedure: CYSTOSCOPY WITH RETROGRADE PYELOGRAM, URETEROSCOPY AND STENT PLACEMENT;  Surgeon: Jerilee Field, MD;  Location: [facility] ORS;  Service: Urology;  Laterality: Right;  . Ureteroscopy with holmium laser lithotripsy  05/17/2015    Procedure: URETEROSCOPY WITH HOLMIUM LASER LITHOTRIPSY;  Surgeon: Jerilee Field, MD;  Location: [facility] ORS;  Service: Urology;;    Allergies Tramadol  Social History Social History  Substance Use Topics  . Smoking status: Current Every Day Smoker -- 0.50 packs/day    Types: Cigarettes  . Smokeless tobacco: Never Used  . Alcohol Use: No    Review of Systems Constitutional: Positive for fever Eyes: Negative for visual changes. ENT: Negative for sore throat. Cardiovascular: Negative for chest pain. Respiratory: Negative for shortness of  breath. Gastrointestinal: Positive for right flank pain Genitourinary: Negative for dysuria. Musculoskeletal: Negative for back pain. Skin: Negative for rash. Neurological: Negative for headaches, focal weakness or numbness.  10-point ROS otherwise negative.  ____________________________________________   PHYSICAL EXAM:  VITAL SIGNS: ED Triage Vitals  Enc Vitals Group     BP 06/05/15 1252 147/110 mmHg     Pulse Rate 06/05/15 1252 141     Resp 06/05/15 1252 18     Temp 06/05/15 1252 98.8 F (37.1 C)     Temp Source 06/05/15 1252 Oral     SpO2 06/05/15 1252 96 %     Weight 06/05/15 1252 140 lb (63.504 kg)     Height 06/05/15 1252  (1.702 m)     Head Cir --      Peak Flow --      Pain Score 06/05/15 1253 8     Pain Loc --      Pain Edu? --      Excl. in GC? --     Constitutional: Alert and oriented. Mild distress Eyes: Conjunctivae are normal. PERRL. Normal extraocular movements. ENT   Head: Normocephalic and atraumatic.   Nose: No congestion/rhinnorhea.   Mouth/Throat: Mucous membranes are moist.   Neck: No stridor. Cardiovascular: Rapid rate, regular rhythm. Normal and symmetric distal pulses are present in all extremities. No murmurs, rubs, or gallops. Respiratory: Normal respiratory effort without tachypnea nor retractions. Breath sounds are clear and equal bilaterally. No wheezes/rales/rhonchi. Gastrointestinal: Right flank and CVA tenderness, normal bowel sounds. Musculoskeletal: Nontender with normal range of motion in all extremities. No joint effusions.  No lower extremity tenderness nor edema. Neurologic:  Normal speech and language. No gross focal neurologic deficits are appreciated. Speech is normal. No gait instability. Skin:  Skin is warm, dry and intact. No rash noted. Psychiatric: Depressed mood and affect, patient tearful. Speech and behavior are normal. Patient exhibits appropriate insight and  judgment.  ____________________________________________  ED COURSE:  Pertinent labs & imaging results that were available during my care of the patient were reviewed by me and considered in my medical decision making (see chart for details). Patient with likely recurrent infection or pyelonephritis. She will need labs or imaging. ____________________________________________    LABS (pertinent positives/negatives)  Labs Reviewed  CBC WITH DIFFERENTIAL/PLATELET - Abnormal; Notable for the following:    RBC 5.23 (*)    RDW 15.1 (*)    Platelets 470 (*)    Basophils Absolute 0.3 (*)    All other components within normal limits  COMPREHENSIVE METABOLIC PANEL - Abnormal; Notable for the following:    Glucose, Bld 117 (*)    Total Protein 9.1 (*)    All other components within normal limits  LIPASE, BLOOD - Abnormal; Notable for the following:    Lipase 21 (*)    All other components within normal limits  URINALYSIS COMPLETEWITH MICROSCOPIC ([facility] ONLY) - Abnormal; Notable for the following:    Color, Urine AMBER (*)    APPearance HAZY (*)    Protein, ur 30 (*)    Bacteria, UA RARE (*)    Squamous Epithelial / LPF 0-5 (*)    All other components within normal limits    RADIOLOGY Images were viewed by me  Ultrasound  IMPRESSION: Normal bilateral kidneys. No sonographic evidence of residual right renal abscess. ____________________________________________  FINAL ASSESSMENT AND PLAN  Fever, flank pain  Plan: Patient with labs and imaging as dictated above. Patient likely have any muscular pain or some type of postoperative pain in the right flank. She is required several doses of pain medicine and muscle relaxants, will discharge with Percocet and Valium. She is advised to follow-up with the urologist in 2 days if not improving.   Emily Filbert, MD   Emily Filbert, MD 06/05/15 [phone]

## 2015-06-15 ENCOUNTER — Encounter: Payer: Self-pay | Admitting: Emergency Medicine

## 2015-06-15 ENCOUNTER — Emergency Department
Admission: EM | Admit: 2015-06-15 | Discharge: 2015-06-15 | Disposition: A | Discharge status: Home / Self Care | Payer: BLUE CROSS/BLUE SHIELD | Attending: Emergency Medicine | Admitting: Emergency Medicine

## 2015-06-15 ENCOUNTER — Emergency Department: Payer: BLUE CROSS/BLUE SHIELD

## 2015-06-15 ENCOUNTER — Other Ambulatory Visit: Payer: Self-pay

## 2015-06-15 DIAGNOSIS — N3090 Cystitis, unspecified without hematuria: Secondary | ICD-10-CM | POA: Diagnosis not present

## 2015-06-15 DIAGNOSIS — Z720 Tobacco use: Secondary | ICD-10-CM | POA: Insufficient documentation

## 2015-06-15 DIAGNOSIS — R109 Unspecified abdominal pain: Secondary | ICD-10-CM

## 2015-06-15 DIAGNOSIS — R531 Weakness: Secondary | ICD-10-CM | POA: Diagnosis present

## 2015-06-15 LAB — URINALYSIS COMPLETE WITH MICROSCOPIC (ARMC ONLY)
Bilirubin Urine: NEGATIVE
Glucose, UA: NEGATIVE mg/dL
KETONES UR: NEGATIVE mg/dL
Nitrite: NEGATIVE
PROTEIN: NEGATIVE mg/dL
Specific Gravity, Urine: 1.006 (ref 1.005–1.030)
pH: 6 (ref 5.0–8.0)

## 2015-06-15 LAB — BASIC METABOLIC PANEL
ANION GAP: 10 (ref 5–15)
BUN: 10 mg/dL (ref 6–20)
CALCIUM: 9.7 mg/dL (ref 8.9–10.3)
CO2: 23 mmol/L (ref 22–32)
Chloride: 104 mmol/L (ref 101–111)
Creatinine, Ser: 0.73 mg/dL (ref 0.44–1.00)
GFR calc Af Amer: >60 mL/min (ref >60)
GFR calc non Af Amer: >60 mL/min (ref >60)
GLUCOSE: 127 mg/dL — AB (ref 65–99)
Potassium: 4.2 mmol/L (ref 3.5–5.1)
Sodium: 137 mmol/L (ref 135–145)

## 2015-06-15 LAB — CBC
HCT: 36.1 % (ref 35.0–47.0)
HEMOGLOBIN: 11.9 g/dL — AB (ref 12.0–16.0)
MCH: 26.2 pg (ref 26.0–34.0)
MCHC: 32.9 g/dL (ref 32.0–36.0)
MCV: 79.6 fL — AB (ref 80.0–100.0)
Platelets: 283 10*3/uL (ref 150–440)
RBC: 4.54 MIL/uL (ref 3.80–5.20)
RDW: 16.3 % — ABNORMAL HIGH (ref 11.5–14.5)
WBC: 11.5 10*3/uL — ABNORMAL HIGH (ref 3.6–11.0)

## 2015-06-15 MED ORDER — DEXTROSE 5 % IV SOLN
1.0000 g | Freq: Once | INTRAVENOUS | Status: AC
  Administered 2015-06-15: 1 g via INTRAVENOUS

## 2015-06-15 MED ORDER — HYDROMORPHONE HCL 1 MG/ML IJ SOLN
1.0000 mg | Freq: Once | INTRAMUSCULAR | Status: AC
  Administered 2015-06-15: 1 mg via INTRAVENOUS

## 2015-06-15 MED ORDER — KETOROLAC TROMETHAMINE 30 MG/ML IJ SOLN
30.0000 mg | Freq: Once | INTRAMUSCULAR | Status: AC
  Administered 2015-06-15: 30 mg via INTRAVENOUS

## 2015-06-15 MED ORDER — KETOROLAC TROMETHAMINE 30 MG/ML IJ SOLN
INTRAMUSCULAR | Status: AC
  Administered 2015-06-15: 30 mg via INTRAVENOUS
  Filled 2015-06-15: qty 1

## 2015-06-15 MED ORDER — DIAZEPAM 5 MG PO TABS: 5.0000 mg | ORAL_TABLET | Freq: Three times a day (TID) | ORAL | Status: DC | PRN

## 2015-06-15 MED ORDER — HYDROMORPHONE HCL 1 MG/ML IJ SOLN
INTRAMUSCULAR | Status: AC
  Administered 2015-06-15: 1 mg via INTRAVENOUS
  Filled 2015-06-15: qty 1

## 2015-06-15 MED ORDER — IBUPROFEN 800 MG PO TABS: 800.0000 mg | ORAL_TABLET | Freq: Three times a day (TID) | ORAL | Status: DC | PRN

## 2015-06-15 MED ORDER — SULFAMETHOXAZOLE-TRIMETHOPRIM 800-160 MG PO TABS: 1.0000 | ORAL_TABLET | Freq: Two times a day (BID) | ORAL | Status: DC

## 2015-06-15 MED ORDER — SODIUM CHLORIDE 0.9 % IV SOLN
Freq: Once | INTRAVENOUS | Status: AC
  Administered 2015-06-15: 16:00:00 via INTRAVENOUS

## 2015-06-15 MED ORDER — DEXTROSE 5 % IV SOLN
INTRAVENOUS | Status: AC
  Administered 2015-06-15: 1 g via INTRAVENOUS
  Filled 2015-06-15: qty 10

## 2015-06-15 NOTE — ED Notes (Signed)
Pt and friend began yelling at MD. This RN was not around for event. Pt is crying at time of d/c but is in no acute distress. Pt verbalized understanding for need to follow up and is able to walk out of ED without difficulty.

## 2015-06-15 NOTE — Discharge Instructions (Signed)
Flank Pain °Flank pain refers to pain that is located on the side of the body between the upper abdomen and the back. The pain may occur over a short period of time (acute) or may be long-term or reoccurring (chronic). It may be mild or severe. Flank pain can be caused by many things. °CAUSES  °Some of the more common causes of flank pain include: °· Muscle strains.   °· Muscle spasms.   °· A disease of your spine (vertebral disk disease).   °· A lung infection (pneumonia).   °· Fluid around your lungs (pulmonary edema).   °· A kidney infection.   °· Kidney stones.   °· A very painful skin rash caused by the chickenpox virus (shingles).   °· Gallbladder disease.   °HOME CARE INSTRUCTIONS  °Home care will depend on the cause of your pain. In general, °· Rest as directed by your caregiver. °· Drink enough fluids to keep your urine clear or pale yellow. °· Only take over-the-counter or prescription medicines as directed by your caregiver. Some medicines may help relieve the pain. °· Tell your caregiver about any changes in your pain. °· Follow up with your caregiver as directed. °SEEK IMMEDIATE MEDICAL CARE IF:  °· Your pain is not controlled with medicine.   °· You have new or worsening symptoms. °· Your pain increases.   °· You have abdominal pain.   °· You have shortness of breath.   °· You have persistent nausea or vomiting.   °· You have swelling in your abdomen.   °· You feel faint or pass out.   °· You have blood in your urine. °· You have a fever or persistent symptoms for more than 2-3 days. °· You have a fever and your symptoms suddenly get worse. °MAKE SURE YOU:  °· Understand these instructions. °· Will watch your condition. °· Will get help right away if you are not doing well or get worse. °Document Released: 11/14/2005 Document Revised: 06/17/2012 Document Reviewed: 05/07/2012 °ExitCare® Patient Information ©2015 [employer]. This information is not intended to replace advice given to you by your  health care provider. Make sure you discuss any questions you have with your health care provider. ° °Urinary Tract Infection °Urinary tract infections (UTIs) can develop anywhere along your urinary tract. Your urinary tract is your body's drainage system for removing wastes and extra water. Your urinary tract includes two kidneys, two ureters, a bladder, and a urethra. Your kidneys are a pair of bean-shaped organs. Each kidney is about the size of your fist. They are located below your ribs, one on each side of your spine. °CAUSES °Infections are caused by microbes, which are microscopic organisms, including fungi, viruses, and bacteria. These organisms are so small that they can only be seen through a microscope. Bacteria are the microbes that most commonly cause UTIs. °SYMPTOMS  °Symptoms of UTIs may vary by age and gender of the patient and by the location of the infection. Symptoms in young women typically include a frequent and intense urge to urinate and a painful, burning feeling in the bladder or urethra during urination. Older women and men are more likely to be tired, shaky, and weak and have muscle aches and abdominal pain. A fever may mean the infection is in your kidneys. Other symptoms of a kidney infection include pain in your back or sides below the ribs, nausea, and vomiting. °DIAGNOSIS °To diagnose a UTI, your caregiver will ask you about your symptoms. Your caregiver also will ask to provide a urine sample. The urine sample will be tested for   bacteria and white blood cells. White blood cells are made by your body to help fight infection. °TREATMENT  °Typically, UTIs can be treated with medication. Because most UTIs are caused by a bacterial infection, they usually can be treated with the use of antibiotics. The choice of antibiotic and length of treatment depend on your symptoms and the type of bacteria causing your infection. °HOME CARE INSTRUCTIONS °· If you were prescribed antibiotics, take  them exactly as your caregiver instructs you. Finish the medication even if you feel better after you have only taken some of the medication. °· Drink enough water and fluids to keep your urine clear or pale yellow. °· Avoid caffeine, tea, and carbonated beverages. They tend to irritate your bladder. °· Empty your bladder often. Avoid holding urine for long periods of time. °· Empty your bladder before and after sexual intercourse. °· After a bowel movement, women should cleanse from front to back. Use each tissue only once. °SEEK MEDICAL CARE IF:  °· You have back pain. °· You develop a fever. °· Your symptoms do not begin to resolve within 3 days. °SEEK IMMEDIATE MEDICAL CARE IF:  °· You have severe back pain or lower abdominal pain. °· You develop chills. °· You have nausea or vomiting. °· You have continued burning or discomfort with urination. °MAKE SURE YOU:  °· Understand these instructions. °· Will watch your condition. °· Will get help right away if you are not doing well or get worse. °Document Released: 07/03/2005 Document Revised: 03/24/2012 Document Reviewed: 11/01/2011 °ExitCare® Patient Information ©2015 [employer]. This information is not intended to replace advice given to you by your health care provider. Make sure you discuss any questions you have with your health care provider. ° °

## 2015-06-15 NOTE — ED Provider Notes (Signed)
[facility] Emergency Department Provider Note     Time seen: ----------------------------------------- 3:59 PM on 06/15/2015 -----------------------------------------    I have reviewed the triage vital signs and the nursing notes.   HISTORY  Chief Complaint Weakness    HPI Morgan Dean is a 39 y.o. female who presents ER for weakness while she was at work today. Patient states she became pale, had some chest pain and near-syncope. Patient reports she had surgery about 3 weeks ago for kidney stones and had a nephrostomy tube after she had an abscess rather right kidney. Patient states the pain I had seen her for 10 days ago had resolved, also she began having hematuria and right flank pain again today. She denies fevers chills or other complaints.   Past Medical History  Diagnosis Date  . Migraine   . Kidney stones   . Renal abscess, right     Patient Active Problem List   Diagnosis Date Noted  . Sepsis 05/27/2015  . Pyelonephritis 05/27/2015  . Renal abscess 05/27/2015  . Ureteral stone with hydronephrosis 05/17/2015    Past Surgical History  Procedure Laterality Date  . Tubal ligation    . Appendectomy    . Cesarean section    . Renal artery stent    . Cystoscopy with retrograde pyelogram, ureteroscopy and stent placement Right 05/17/2015    Procedure: CYSTOSCOPY WITH RETROGRADE PYELOGRAM, URETEROSCOPY AND STENT PLACEMENT;  Surgeon: Jerilee Field, MD;  Location: [facility] ORS;  Service: Urology;  Laterality: Right;  . Ureteroscopy with holmium laser lithotripsy  05/17/2015    Procedure: URETEROSCOPY WITH HOLMIUM LASER LITHOTRIPSY;  Surgeon: Jerilee Field, MD;  Location: [facility] ORS;  Service: Urology;;    Allergies Tramadol  Social History Social History  Substance Use Topics  . Smoking status: Current Every Day Smoker -- 0.50 packs/day    Types: Cigarettes  . Smokeless tobacco: Never Used  . Alcohol Use: No    Review of  Systems Constitutional: Negative for fever. Eyes: Negative for visual changes. ENT: Negative for sore throat. Cardiovascular: Negative for chest pain. Respiratory: Negative for shortness of breath. Gastrointestinal: Positive for right flank pain Genitourinary: Negative for dysuria. Musculoskeletal: Negative for back pain. Skin: Negative for rash. Neurological: Negative for headaches, positive for weakness  10-point ROS otherwise negative.  ____________________________________________   PHYSICAL EXAM:  VITAL SIGNS: ED Triage Vitals  Enc Vitals Group     BP 06/15/15 1331 147/95 mmHg     Pulse Rate 06/15/15 1331 112     Resp 06/15/15 1331 20     Temp 06/15/15 1331 98.7 F (37.1 C)     Temp Source 06/15/15 1331 Oral     SpO2 06/15/15 1331 98 %     Weight 06/15/15 1331 140 lb (63.504 kg)     Height 06/15/15 1331  (1.702 m)     Head Cir --      Peak Flow --      Pain Score 06/15/15 1334 8     Pain Loc --      Pain Edu? --      Excl. in GC? --     Constitutional: Alert and oriented. Mild distress Eyes: Conjunctivae are normal. PERRL. Normal extraocular movements. ENT   Head: Normocephalic and atraumatic.   Nose: No congestion/rhinnorhea.   Mouth/Throat: Mucous membranes are moist.   Neck: No stridor. Cardiovascular: Normal rate, regular rhythm. Normal and symmetric distal pulses are present in all extremities. No murmurs, rubs, or gallops. Respiratory: Normal respiratory  effort without tachypnea nor retractions. Breath sounds are clear and equal bilaterally. No wheezes/rales/rhonchi. Gastrointestinal: Right flank and CVA tenderness, no rebound or guarding. Normal bowel sounds. Musculoskeletal: Nontender with normal range of motion in all extremities. No joint effusions.  No lower extremity tenderness nor edema. Neurologic:  Normal speech and language. No gross focal neurologic deficits are appreciated. Speech is normal. No gait instability. Skin:  Skin is  warm, dry and intact. No rash noted. Psychiatric: Mood and affect are normal. Speech and behavior are normal. Patient exhibits appropriate insight and judgment. ____________________________________________  EKG: Interpreted by me. Sinus tachycardia with a rate of 115 bpm, normal PR interval, normal QRS with, normal QT interval.  ____________________________________________  ED COURSE:  Pertinent labs & imaging results that were available during my care of the patient were reviewed by me and considered in my medical decision making (see chart for details). We'll repeat her ultrasound to check for kidney stone or perinephric abscess. She'll receive fluid and IV Dilaudid for pain ____________________________________________    LABS (pertinent positives/negatives)  Labs Reviewed  BASIC METABOLIC PANEL - Abnormal; Notable for the following:    Glucose, Bld 127 (*)    All other components within normal limits  CBC - Abnormal; Notable for the following:    WBC 11.5 (*)    Hemoglobin 11.9 (*)    MCV 79.6 (*)    RDW 16.3 (*)    All other components within normal limits  URINALYSIS COMPLETEWITH MICROSCOPIC ([facility] ONLY) - Abnormal; Notable for the following:    Color, Urine YELLOW (*)    APPearance CLOUDY (*)    Hgb urine dipstick 3+ (*)    Leukocytes, UA TRACE (*)    Bacteria, UA MANY (*)    Squamous Epithelial / LPF 6-30 (*)    All other components within normal limits    RADIOLOGY Images were viewed by me  Renal ultrasound  IMPRESSION: Study within normal limits. ____________________________________________  FINAL ASSESSMENT AND PLAN  Flank pain, hematuria  Plan: Patient with labs and imaging as dictated above. There is no specific cause for her symptoms. This is second time in as many weeks but I have seen her with severe uncontrollable side pain without explanation. Ultrasound of both cases has been unremarkable, previously her urine looked normal, now her urine is  suggestive of infection. She is given Rocephin to cover for pyelonephritis, multiple doses of IV Dilaudid which did not seem to help her pain. She was also given IV Toradol, will be discharged with Septra and tramadol. She is advised to follow-up with her doctor without fail.   Emily Filbert, MD   Emily Filbert, MD 06/15/15 [phone]

## 2015-06-15 NOTE — ED Notes (Addendum)
Pt to ed with c/o weakness today at work.  Pt states she started feeling weak today at work, became pale, had chest pain, and had near syncopal episode.  Reports she recently had surgery about 3 weeks ago for kidney stones, blockage of right kidney and infection.  Pt states had tube in right kidney removed about 1 1/2 weeks ago, reports yesterday pain and burning with urination, today noticed reddish color to urine and pain and burning continues.

## 2015-06-15 NOTE — ED Notes (Signed)
Pt reports continued pain at this time. MD notified.

## 2015-08-24 ENCOUNTER — Encounter: Payer: Self-pay | Admitting: Emergency Medicine

## 2015-08-24 ENCOUNTER — Emergency Department
Admission: EM | Admit: 2015-08-24 | Discharge: 2015-08-24 | Disposition: A | Discharge status: Home / Self Care | Payer: Worker's Compensation | Attending: Emergency Medicine | Admitting: Emergency Medicine

## 2015-08-24 DIAGNOSIS — Y9389 Activity, other specified: Secondary | ICD-10-CM | POA: Diagnosis not present

## 2015-08-24 DIAGNOSIS — T23102A Burn of first degree of left hand, unspecified site, initial encounter: Secondary | ICD-10-CM | POA: Insufficient documentation

## 2015-08-24 DIAGNOSIS — Y990 Civilian activity done for income or pay: Secondary | ICD-10-CM | POA: Diagnosis not present

## 2015-08-24 DIAGNOSIS — F17210 Nicotine dependence, cigarettes, uncomplicated: Secondary | ICD-10-CM | POA: Diagnosis not present

## 2015-08-24 DIAGNOSIS — Y9289 Other specified places as the place of occurrence of the external cause: Secondary | ICD-10-CM | POA: Insufficient documentation

## 2015-08-24 DIAGNOSIS — Z792 Long term (current) use of antibiotics: Secondary | ICD-10-CM | POA: Diagnosis not present

## 2015-08-24 DIAGNOSIS — T23002A Burn of unspecified degree of left hand, unspecified site, initial encounter: Secondary | ICD-10-CM | POA: Diagnosis present

## 2015-08-24 DIAGNOSIS — X12XXXA Contact with other hot fluids, initial encounter: Secondary | ICD-10-CM | POA: Diagnosis not present

## 2015-08-24 DIAGNOSIS — Z79899 Other long term (current) drug therapy: Secondary | ICD-10-CM | POA: Insufficient documentation

## 2015-08-24 MED ORDER — ONDANSETRON HCL 4 MG/2ML IJ SOLN
INTRAMUSCULAR | Status: AC
  Filled 2015-08-24: qty 2

## 2015-08-24 MED ORDER — NAPROXEN 500 MG PO TABS: 500.0000 mg | ORAL_TABLET | Freq: Two times a day (BID) | ORAL | Status: AC

## 2015-08-24 MED ORDER — SILVER SULFADIAZINE 1 % EX CREA: TOPICAL_CREAM | CUTANEOUS | Status: AC

## 2015-08-24 MED ORDER — HYDROMORPHONE HCL 1 MG/ML IJ SOLN
INTRAMUSCULAR | Status: AC
  Filled 2015-08-24: qty 1

## 2015-08-24 MED ORDER — HYDROCODONE-ACETAMINOPHEN 5-325 MG PO TABS: 1.0000 | ORAL_TABLET | ORAL | Status: DC | PRN

## 2015-08-24 MED ORDER — SILVER SULFADIAZINE 1 % EX CREA
TOPICAL_CREAM | Freq: Once | CUTANEOUS | Status: AC
  Administered 2015-08-24: 10:00:00 via TOPICAL
  Filled 2015-08-24: qty 85

## 2015-08-24 MED ORDER — KETOROLAC TROMETHAMINE 60 MG/2ML IM SOLN
60.0000 mg | Freq: Once | INTRAMUSCULAR | Status: AC
  Administered 2015-08-24: 60 mg via INTRAMUSCULAR
  Filled 2015-08-24: qty 2

## 2015-08-24 NOTE — ED Notes (Signed)
Pt states she was at work rinsing out some dishes and used the hot water from the kettle and the water spilled onto left hand, c/o burn to left hand, no blistering noted at this time

## 2015-08-24 NOTE — ED Provider Notes (Signed)
[facility] Emergency Department Provider Note ____________________________________________  Time seen: Approximately 9:47 AM  I have reviewed the triage vital signs and the nursing notes.   HISTORY  Chief Complaint Burn   HPI Morgan Dean is a 39 y.o. female who presents to the emergency department for evaluation of a burn on her left hand. She states that she accidentally poured boiling water on her hand while at work. The entire hand is painful, but the sum in the webspace between the second third digits is the most painful.   Past Medical History  Diagnosis Date  . Migraine   . Kidney stones   . Renal abscess, right     Patient Active Problem List   Diagnosis Date Noted  . Sepsis (HCC) 05/27/2015  . Pyelonephritis 05/27/2015  . Renal abscess 05/27/2015  . Ureteral stone with hydronephrosis 05/17/2015    Past Surgical History  Procedure Laterality Date  . Tubal ligation    . Appendectomy    . Cesarean section    . Renal artery stent    . Cystoscopy with retrograde pyelogram, ureteroscopy and stent placement Right 05/17/2015    Procedure: CYSTOSCOPY WITH RETROGRADE PYELOGRAM, URETEROSCOPY AND STENT PLACEMENT;  Surgeon: Jerilee FieldMatthew Eskridge, MD;  Location: [facility] ORS;  Service: Urology;  Laterality: Right;  . Ureteroscopy with holmium laser lithotripsy  05/17/2015    Procedure: URETEROSCOPY WITH HOLMIUM LASER LITHOTRIPSY;  Surgeon: Jerilee FieldMatthew Eskridge, MD;  Location: [facility] ORS;  Service: Urology;;    Current Outpatient Rx  Name  Route  Sig  Dispense  Refill  . ciprofloxacin (CIPRO) 500 MG tablet   Oral   Take 1 tablet (500 mg total) by mouth 2 (two) times daily.   14 tablet   0   . diazepam (VALIUM) 5 MG tablet   Oral   Take 1 tablet (5 mg total) by mouth every 8 (eight) hours as needed for muscle spasms.   20 tablet   0   . diazepam (VALIUM) 5 MG tablet   Oral   Take 1 tablet (5 mg total) by mouth every 8 (eight) hours as needed for muscle  spasms.   20 tablet   0   . docusate sodium (COLACE) 100 MG capsule   Oral   Take 1 capsule (100 mg total) by mouth 2 (two) times daily.   10 capsule   0   . HYDROcodone-acetaminophen (NORCO/VICODIN) 5-325 MG tablet   Oral   Take 1 tablet by mouth every 4 (four) hours as needed.   12 tablet   0   . ibuprofen (ADVIL,MOTRIN) 800 MG tablet   Oral   Take 1 tablet (800 mg total) by mouth every 8 (eight) hours as needed.   30 tablet   0   . naproxen (NAPROSYN) 500 MG tablet   Oral   Take 1 tablet (500 mg total) by mouth 2 (two) times daily with a meal.   60 tablet   2   . nicotine (NICODERM CQ - DOSED IN MG/24 HOURS) 21 mg/24hr patch   Transdermal   Place 1 patch (21 mg total) onto the skin daily. Patient not taking: Reported on 06/02/2015   28 patch   0   . oxyCODONE (OXY IR/ROXICODONE) 5 MG immediate release tablet   Oral   Take 1 tablet (5 mg total) by mouth every 4 (four) hours as needed for moderate pain.   30 tablet   0   . oxyCODONE-acetaminophen (ROXICET) 5-325 MG per tablet  Oral   Take 1 tablet by mouth every 6 (six) hours as needed.   20 tablet   0   . silver sulfADIAZINE (SILVADENE) 1 % cream      Apply to affected area daily   50 g   1   . sulfamethoxazole-trimethoprim (BACTRIM DS) 800-160 MG per tablet   Oral   Take 1 tablet by mouth 2 (two) times daily.   6 tablet   0     Allergies Tramadol  Family History  Problem Relation Age of Onset  . CAD    . Hypertension    . Kidney failure Neg Hx   . Hematuria Neg Hx   . Sickle cell trait Neg Hx     Social History Social History  Substance Use Topics  . Smoking status: Current Every Day Smoker -- 0.50 packs/day    Types: Cigarettes  . Smokeless tobacco: Never Used  . Alcohol Use: No    Review of Systems   Constitutional: No fever/chills Eyes: No visual changes. ENT: No congestion or rhinorrhea Cardiovascular: Denies chest pain. Respiratory: Denies shortness of  breath. Gastrointestinal: No abdominal pain.  No nausea, no vomiting.  No diarrhea.  No constipation. Genitourinary: Negative for dysuria. Musculoskeletal: Negative for back pain. Skin: Positive for burn to the left hand Neurological: Negative for headaches, focal weakness or numbness.  10-point ROS otherwise negative.  ____________________________________________   PHYSICAL EXAM:  VITAL SIGNS: ED Triage Vitals  Enc Vitals Group     BP 08/24/15 0936 128/106 mmHg     Pulse Rate 08/24/15 0936 110     Resp 08/24/15 0936 18     Temp 08/24/15 0936 98 F (36.7 C)     Temp Source 08/24/15 0936 Oral     SpO2 08/24/15 0936 100 %     Weight 08/24/15 0936 140 lb (63.504 kg)     Height 08/24/15 0936  (1.676 m)     Head Cir --      Peak Flow --      Pain Score 08/24/15 0937 9     Pain Loc --      Pain Edu? --      Excl. in GC? --     Constitutional: Alert and oriented. Well appearing and in no acute distress. Eyes: Conjunctivae are normal. PERRL. EOMI. Head: Atraumatic. Nose: No congestion/rhinnorhea. Mouth/Throat: Mucous membranes are moist.  Oropharynx non-erythematous. No oral lesions. Neck: No stridor. Cardiovascular: Normal rate, regular rhythm.  Good peripheral circulation. Respiratory: Normal respiratory effort.  No retractions. Lungs CTAB. Gastrointestinal: Soft and nontender. No distention. No abdominal bruits.  Musculoskeletal: No lower extremity tenderness nor edema.  No joint effusions. Neurologic:  Normal speech and language. No gross focal neurologic deficits are appreciated. Speech is normal. No gait instability. Skin:  Erythema noted to the left hand on both the palmar and dorsal aspect below the wrist, no blistering or open skin noted; Negative for petechiae.  Psychiatric: Mood and affect are normal. Speech and behavior are normal.  ____________________________________________   LABS (all labs ordered are listed, but only abnormal results are  displayed)  Labs Reviewed - No data to display ____________________________________________  EKG   ____________________________________________  RADIOLOGY  Not indicated ____________________________________________   PROCEDURES  Procedure(s) performed: None ____________________________________________   INITIAL IMPRESSION / ASSESSMENT AND PLAN / ED COURSE  Pertinent labs & imaging results that were available during my care of the patient were reviewed by me and considered in my medical decision making (see chart  for details).  Patient was advised to follow up with PCP or wound care center. Silvadene dressing applied.   ____________________________________________   FINAL CLINICAL IMPRESSION(S) / ED DIAGNOSES  Final diagnoses:  Burn, hand, first degree, left, initial encounter        Chinita Pester, FNP 08/24/15 1426  Darien Ramus, MD 08/24/15 [phone]

## 2015-08-24 NOTE — Discharge Instructions (Signed)
Burn Care °Your skin is a natural barrier to infection. It is the largest organ of your body. Burns damage this natural protection. To help prevent infection, it is very important to follow your caregiver's instructions in the care of your burn. °Burns are classified as: °· First degree. There is only redness of the skin (erythema). No scarring is expected. °· Second degree. There is blistering of the skin. Scarring may occur with deeper burns. °· Third degree. All layers of the skin are injured, and scarring is expected. °HOME CARE INSTRUCTIONS  °· Wash your hands well before changing your bandage. °· Change your bandage as often as directed by your caregiver. °¨ Remove the old bandage. If the bandage sticks, you may soak it off with cool, clean water. °¨ Cleanse the burn thoroughly but gently with mild soap and water. °¨ Pat the area dry with a clean, dry cloth. °¨ Apply a thin layer of antibacterial cream to the burn. °¨ Apply a clean bandage as instructed by your caregiver. °¨ Keep the bandage as clean and dry as possible. °· Elevate the affected area for the first 24 hours, then as instructed by your caregiver. °· Only take over-the-counter or prescription medicines for pain, discomfort, or fever as directed by your caregiver. °SEEK IMMEDIATE MEDICAL CARE IF:  °· You develop excessive pain. °· You develop redness, tenderness, swelling, or red streaks near the burn. °· The burned area develops yellowish-white fluid (pus) or a bad smell. °· You have a fever. °MAKE SURE YOU:  °· Understand these instructions. °· Will watch your condition. °· Will get help right away if you are not doing well or get worse. °  °This information is not intended to replace advice given to you by your health care provider. Make sure you discuss any questions you have with your health care provider. °  °Document Released: 09/23/2005 Document Revised: 12/16/2011 Document Reviewed: 02/13/2011 °Elsevier Interactive Patient Education ©2016  [employer] ° °

## 2015-09-08 ENCOUNTER — Emergency Department: Payer: BLUE CROSS/BLUE SHIELD

## 2015-09-08 ENCOUNTER — Encounter: Payer: Self-pay | Admitting: Emergency Medicine

## 2015-09-08 ENCOUNTER — Emergency Department
Admission: EM | Admit: 2015-09-08 | Discharge: 2015-09-08 | Disposition: A | Discharge status: Home / Self Care | Payer: BLUE CROSS/BLUE SHIELD | Attending: Emergency Medicine | Admitting: Emergency Medicine

## 2015-09-08 DIAGNOSIS — Z792 Long term (current) use of antibiotics: Secondary | ICD-10-CM | POA: Insufficient documentation

## 2015-09-08 DIAGNOSIS — R112 Nausea with vomiting, unspecified: Secondary | ICD-10-CM | POA: Diagnosis not present

## 2015-09-08 DIAGNOSIS — R109 Unspecified abdominal pain: Secondary | ICD-10-CM | POA: Diagnosis present

## 2015-09-08 DIAGNOSIS — Z3202 Encounter for pregnancy test, result negative: Secondary | ICD-10-CM | POA: Insufficient documentation

## 2015-09-08 DIAGNOSIS — B9689 Other specified bacterial agents as the cause of diseases classified elsewhere: Secondary | ICD-10-CM

## 2015-09-08 DIAGNOSIS — Z791 Long term (current) use of non-steroidal anti-inflammatories (NSAID): Secondary | ICD-10-CM | POA: Diagnosis not present

## 2015-09-08 DIAGNOSIS — N760 Acute vaginitis: Secondary | ICD-10-CM | POA: Diagnosis not present

## 2015-09-08 DIAGNOSIS — F17210 Nicotine dependence, cigarettes, uncomplicated: Secondary | ICD-10-CM | POA: Diagnosis not present

## 2015-09-08 DIAGNOSIS — R102 Pelvic and perineal pain: Secondary | ICD-10-CM

## 2015-09-08 DIAGNOSIS — Z79899 Other long term (current) drug therapy: Secondary | ICD-10-CM | POA: Diagnosis not present

## 2015-09-08 LAB — URINALYSIS COMPLETE WITH MICROSCOPIC (ARMC ONLY)
BILIRUBIN URINE: NEGATIVE
Bacteria, UA: NONE SEEN
GLUCOSE, UA: NEGATIVE mg/dL
KETONES UR: NEGATIVE mg/dL
Leukocytes, UA: NEGATIVE
NITRITE: NEGATIVE
Protein, ur: NEGATIVE mg/dL
SPECIFIC GRAVITY, URINE: 1.014 (ref 1.005–1.030)
pH: 7 (ref 5.0–8.0)

## 2015-09-08 LAB — COMPREHENSIVE METABOLIC PANEL
ALK PHOS: 73 U/L (ref 38–126)
ALT: 9 U/L — ABNORMAL LOW (ref 14–54)
ANION GAP: 6 (ref 5–15)
AST: 14 U/L — ABNORMAL LOW (ref 15–41)
Albumin: 4.1 g/dL (ref 3.5–5.0)
BUN: 11 mg/dL (ref 6–20)
CALCIUM: 9 mg/dL (ref 8.9–10.3)
CO2: 27 mmol/L (ref 22–32)
Chloride: 105 mmol/L (ref 101–111)
Creatinine, Ser: 0.86 mg/dL (ref 0.44–1.00)
GFR calc non Af Amer: >60 mL/min (ref >60)
Glucose, Bld: 85 mg/dL (ref 65–99)
Potassium: 3.5 mmol/L (ref 3.5–5.1)
SODIUM: 138 mmol/L (ref 135–145)
TOTAL PROTEIN: 7.5 g/dL (ref 6.5–8.1)

## 2015-09-08 LAB — WET PREP, GENITAL
SPERM: NONE SEEN
Trich, Wet Prep: NONE SEEN
Yeast Wet Prep HPF POC: NONE SEEN

## 2015-09-08 LAB — CBC
HCT: 37.9 % (ref 35.0–47.0)
HEMOGLOBIN: 12.2 g/dL (ref 12.0–16.0)
MCH: 26.4 pg (ref 26.0–34.0)
MCHC: 32.1 g/dL (ref 32.0–36.0)
MCV: 82.4 fL (ref 80.0–100.0)
Platelets: 236 10*3/uL (ref 150–440)
RBC: 4.6 MIL/uL (ref 3.80–5.20)
RDW: 17.6 % — ABNORMAL HIGH (ref 11.5–14.5)
WBC: 6.6 10*3/uL (ref 3.6–11.0)

## 2015-09-08 LAB — CHLAMYDIA/NGC RT PCR (ARMC ONLY)
CHLAMYDIA TR: NOT DETECTED
N gonorrhoeae: NOT DETECTED

## 2015-09-08 LAB — POCT PREGNANCY, URINE: Preg Test, Ur: NEGATIVE

## 2015-09-08 LAB — LIPASE, BLOOD: Lipase: 40 U/L (ref 11–51)

## 2015-09-08 MED ORDER — CEFTRIAXONE SODIUM 250 MG IJ SOLR
250.0000 mg | Freq: Once | INTRAMUSCULAR | Status: AC
  Administered 2015-09-08: 250 mg via INTRAMUSCULAR

## 2015-09-08 MED ORDER — ONDANSETRON HCL 4 MG/2ML IJ SOLN
4.0000 mg | Freq: Once | INTRAMUSCULAR | Status: AC
  Administered 2015-09-08: 4 mg via INTRAVENOUS

## 2015-09-08 MED ORDER — KETOROLAC TROMETHAMINE 30 MG/ML IJ SOLN
INTRAMUSCULAR | Status: AC
  Administered 2015-09-08: 30 mg via INTRAVENOUS
  Filled 2015-09-08: qty 1

## 2015-09-08 MED ORDER — IBUPROFEN 800 MG PO TABS: 800.0000 mg | ORAL_TABLET | Freq: Three times a day (TID) | ORAL | Status: DC | PRN

## 2015-09-08 MED ORDER — LIDOCAINE HCL (PF) 1 % IJ SOLN
INTRAMUSCULAR | Status: AC
  Administered 2015-09-08: 5 mL
  Filled 2015-09-08: qty 5

## 2015-09-08 MED ORDER — METRONIDAZOLE 500 MG PO TABS: 500.0000 mg | ORAL_TABLET | Freq: Two times a day (BID) | ORAL | Status: AC

## 2015-09-08 MED ORDER — OXYCODONE-ACETAMINOPHEN 5-325 MG PO TABS: 1.0000 | ORAL_TABLET | Freq: Four times a day (QID) | ORAL | Status: DC | PRN

## 2015-09-08 MED ORDER — ONDANSETRON HCL 4 MG/2ML IJ SOLN
INTRAMUSCULAR | Status: AC
  Administered 2015-09-08: 4 mg via INTRAVENOUS
  Filled 2015-09-08: qty 2

## 2015-09-08 MED ORDER — OXYCODONE-ACETAMINOPHEN 5-325 MG PO TABS
1.0000 | ORAL_TABLET | Freq: Once | ORAL | Status: AC
  Administered 2015-09-08: 1 via ORAL

## 2015-09-08 MED ORDER — SODIUM CHLORIDE 0.9 % IV BOLUS (SEPSIS)
1000.0000 mL | Freq: Once | INTRAVENOUS | Status: AC
  Administered 2015-09-08: 1000 mL via INTRAVENOUS

## 2015-09-08 MED ORDER — CEFTRIAXONE SODIUM 250 MG IJ SOLR
INTRAMUSCULAR | Status: AC
  Administered 2015-09-08: 250 mg via INTRAMUSCULAR
  Filled 2015-09-08: qty 250

## 2015-09-08 MED ORDER — AZITHROMYCIN 250 MG PO TABS
ORAL_TABLET | ORAL | Status: AC
  Administered 2015-09-08: 1000 mg via ORAL
  Filled 2015-09-08: qty 4

## 2015-09-08 MED ORDER — OXYCODONE-ACETAMINOPHEN 5-325 MG PO TABS
ORAL_TABLET | ORAL | Status: AC
  Administered 2015-09-08: 1 via ORAL
  Filled 2015-09-08: qty 1

## 2015-09-08 MED ORDER — HYDROMORPHONE HCL 1 MG/ML IJ SOLN
0.5000 mg | Freq: Once | INTRAMUSCULAR | Status: AC
  Administered 2015-09-08: 0.5 mg via INTRAVENOUS

## 2015-09-08 MED ORDER — ONDANSETRON 4 MG PO TBDP
4.0000 mg | ORAL_TABLET | Freq: Once | ORAL | Status: AC
  Administered 2015-09-08: 4 mg via ORAL

## 2015-09-08 MED ORDER — AZITHROMYCIN 250 MG PO TABS
1000.0000 mg | ORAL_TABLET | Freq: Once | ORAL | Status: AC
  Administered 2015-09-08: 1000 mg via ORAL

## 2015-09-08 MED ORDER — KETOROLAC TROMETHAMINE 30 MG/ML IJ SOLN
30.0000 mg | Freq: Once | INTRAMUSCULAR | Status: AC
  Administered 2015-09-08: 30 mg via INTRAVENOUS

## 2015-09-08 MED ORDER — HYDROMORPHONE HCL 1 MG/ML IJ SOLN
INTRAMUSCULAR | Status: AC
  Administered 2015-09-08: 0.5 mg via INTRAVENOUS
  Filled 2015-09-08: qty 1

## 2015-09-08 MED ORDER — ONDANSETRON 4 MG PO TBDP
ORAL_TABLET | ORAL | Status: AC
  Administered 2015-09-08: 4 mg via ORAL
  Filled 2015-09-08: qty 1

## 2015-09-08 NOTE — ED Notes (Signed)
Pt reports she has increased urinary frequency, burning upon urination, and bilateral flank pain.

## 2015-09-08 NOTE — Discharge Instructions (Signed)
Today we treated you for gonorrhea and chlamydia. Please follow up the results of your gonorrhea and chlamydia testing in 2-3 days. You will go home with a prescription for Flagyl, which is to treat bacterial vaginosis.   Please return to the emergency department if he develops severe pain, fever, inability to keep down fluids, or any other symptoms concerning to you.

## 2015-09-08 NOTE — ED Provider Notes (Signed)
[facility] Emergency Department Provider Note  ____________________________________________  Time seen: Approximately 3:58 PM  I have reviewed the triage vital signs and the nursing notes.   HISTORY  Chief Complaint Abdominal Pain    HPI Morgan Dean is a 39 y.o. female with a recent history of ureteral stent for removal of renal stone followed by percutaneous tube required for renal abscess in 84/16 presenting with urinary symptoms and nausea and vomiting. Patient states that over the last several days she has been having painful urination, and the sensation of incomplete urination with frequency. She has also had a "burning" sensation throughout the low back but worse on the right. She feels pressure sensation in the suprapubic and pelvic areas. She has noted some change in the color of her vaginal discharge which is now more white or dark compared to her clear discharge. LMP 11 4/14 which was normal. No fevers or chills. She had several episodes of nausea and vomiting yesterday which have now resolved.   Past Medical History  Diagnosis Date  . Migraine   . Kidney stones   . Renal abscess, right     Patient Active Problem List   Diagnosis Date Noted  . Sepsis (HCC) 05/27/2015  . Pyelonephritis 05/27/2015  . Renal abscess 05/27/2015  . Ureteral stone with hydronephrosis 05/17/2015    Past Surgical History  Procedure Laterality Date  . Tubal ligation    . Appendectomy    . Cesarean section    . Renal artery stent    . Cystoscopy with retrograde pyelogram, ureteroscopy and stent placement Right 05/17/2015    Procedure: CYSTOSCOPY WITH RETROGRADE PYELOGRAM, URETEROSCOPY AND STENT PLACEMENT;  Surgeon: Jerilee Field, MD;  Location: [facility] ORS;  Service: Urology;  Laterality: Right;  . Ureteroscopy with holmium laser lithotripsy  05/17/2015    Procedure: URETEROSCOPY WITH HOLMIUM LASER LITHOTRIPSY;  Surgeon: Jerilee Field, MD;  Location: [facility] ORS;   Service: Urology;;    Current Outpatient Rx  Name  Route  Sig  Dispense  Refill  . ciprofloxacin (CIPRO) 500 MG tablet   Oral   Take 1 tablet (500 mg total) by mouth 2 (two) times daily.   14 tablet   0   . diazepam (VALIUM) 5 MG tablet   Oral   Take 1 tablet (5 mg total) by mouth every 8 (eight) hours as needed for muscle spasms.   20 tablet   0   . diazepam (VALIUM) 5 MG tablet   Oral   Take 1 tablet (5 mg total) by mouth every 8 (eight) hours as needed for muscle spasms.   20 tablet   0   . docusate sodium (COLACE) 100 MG capsule   Oral   Take 1 capsule (100 mg total) by mouth 2 (two) times daily.   10 capsule   0   . HYDROcodone-acetaminophen (NORCO/VICODIN) 5-325 MG tablet   Oral   Take 1 tablet by mouth every 4 (four) hours as needed.   12 tablet   0   . ibuprofen (ADVIL,MOTRIN) 800 MG tablet   Oral   Take 1 tablet (800 mg total) by mouth every 8 (eight) hours as needed for moderate pain (with food).   20 tablet   0   . metroNIDAZOLE (FLAGYL) 500 MG tablet   Oral   Take 1 tablet (500 mg total) by mouth 2 (two) times daily.   14 tablet   0   . naproxen (NAPROSYN) 500 MG tablet   Oral  Take 1 tablet (500 mg total) by mouth 2 (two) times daily with a meal.   60 tablet   2   . nicotine (NICODERM CQ - DOSED IN MG/24 HOURS) 21 mg/24hr patch   Transdermal   Place 1 patch (21 mg total) onto the skin daily. Patient not taking: Reported on 06/02/2015   28 patch   0   . oxyCODONE-acetaminophen (PERCOCET/ROXICET) 5-325 MG tablet   Oral   Take 1 tablet by mouth every 6 (six) hours as needed for severe pain.   6 tablet   0   . silver sulfADIAZINE (SILVADENE) 1 % cream      Apply to affected area daily   50 g   1   . sulfamethoxazole-trimethoprim (BACTRIM DS) 800-160 MG per tablet   Oral   Take 1 tablet by mouth 2 (two) times daily.   6 tablet   0     Allergies Tramadol  Family History  Problem Relation Age of Onset  . CAD    . Hypertension     . Kidney failure Neg Hx   . Hematuria Neg Hx   . Sickle cell trait Neg Hx     Social History Social History  Substance Use Topics  . Smoking status: Current Every Day Smoker -- 0.50 packs/day    Types: Cigarettes  . Smokeless tobacco: Never Used  . Alcohol Use: No    Review of Systems Constitutional: No fever/chills area and Eyes: No visual changes. ENT: No sore throat. Cardiovascular: Denies chest pain, palpitations. Respiratory: Denies shortness of breath.  No cough. Gastrointestinal: No abdominal pain.  No nausea, no vomiting.  No diarrhea.  No constipation. Genitourinary: Positive for dysuria, incomplete voiding, and urinary frequency. Musculoskeletal: Positive for bilateral back and flank pain. Skin: Negative for rash. Neurological: Negative for headaches, focal weakness or numbness.  10-point ROS otherwise negative.  ____________________________________________   PHYSICAL EXAM:  VITAL SIGNS: ED Triage Vitals  Enc Vitals Group     BP 09/08/15 1320 145/83 mmHg     Pulse Rate 09/08/15 1320 97     Resp 09/08/15 1320 20     Temp 09/08/15 1320 98.1 F (36.7 C)     Temp Source 09/08/15 1320 Oral     SpO2 09/08/15 1320 98 %     Weight 09/08/15 1320 140 lb (63.504 kg)     Height 09/08/15 1320  (1.702 m)     Head Cir --      Peak Flow --      Pain Score 09/08/15 1320 8     Pain Loc --      Pain Edu? --      Excl. in GC? --     Constitutional: Alert and oriented. Uncomfortable appearing sitting on the stretcher. Answer question appropriately. Eyes: Conjunctivae are normal.  EOMI. Head: Atraumatic. Nose: No congestion/rhinnorhea. Mouth/Throat: Mucous membranes are moist.  Neck: No stridor.  Supple.   Cardiovascular: Normal rate, regular rhythm. No murmurs, rubs or gallops.  Respiratory: Normal respiratory effort.  No retractions. Lungs CTAB.  No wheezes, rales or ronchi. Gastrointestinal: Right CVA tenderness. Soft and nondistended. Mild tenderness to  palpation in the right lower quadrant without rebound or guarding. No peritoneal signs. GU: Normal-appearing external genitalia without lesions. Speculum exam reveals normal-appearing cervix with thick white discharge in a moderate amount. Bimanual exam reveals significant CMT, without adnexal masses or tenderness, positive tenderness to palpation of the suprapubic region. Musculoskeletal: No LE edema.  Neurologic:  Normal speech  and language. No gross focal neurologic deficits are appreciated.  Skin:  Skin is warm, dry and intact. No rash noted. Psychiatric: Mood and affect are normal. Speech and behavior are normal.  Normal judgement.  ____________________________________________   LABS (all labs ordered are listed, but only abnormal results are displayed)  Labs Reviewed  WET PREP, GENITAL - Abnormal; Notable for the following:    Clue Cells Wet Prep HPF POC FEW (*)    WBC, Wet Prep HPF POC FEW (*)    All other components within normal limits  COMPREHENSIVE METABOLIC PANEL - Abnormal; Notable for the following:    AST 14 (*)    ALT 9 (*)    Total Bilirubin <0.1 (*)    All other components within normal limits  CBC - Abnormal; Notable for the following:    RDW 17.6 (*)    All other components within normal limits  URINALYSIS COMPLETEWITH MICROSCOPIC ([facility] ONLY) - Abnormal; Notable for the following:    Color, Urine YELLOW (*)    APPearance HAZY (*)    Hgb urine dipstick 3+ (*)    Squamous Epithelial / LPF 0-5 (*)    All other components within normal limits  CHLAMYDIA/NGC RT PCR ([facility] ONLY)  LIPASE, BLOOD  POCT PREGNANCY, URINE  POC URINE PREG, ED   ____________________________________________  EKG  Not indicated ____________________________________________  RADIOLOGY  Ct Renal Stone Study  09/08/2015  CLINICAL DATA:  Increased urinary frequency, burning with urination, BILATERAL flank pain, history kidney stones EXAM: CT ABDOMEN AND PELVIS WITHOUT CONTRAST  TECHNIQUE: Multidetector CT imaging of the abdomen and pelvis was performed following the standard protocol without IV contrast. Sagittal and coronal MPR images reconstructed from axial data set. Oral contrast not administered for this indication. COMPARISON:  05/27/2015 FINDINGS: Lung bases clear. Contracted gallbladder. Minimal focal fatty infiltration of liver adjacent to falciform fissure. Tiny nonobstructing LEFT renal calculi. Kidneys otherwise unremarkable without hydronephrosis, ureteral calcification or ureteral dilatation. Normal appearing bladder. Liver, spleen, pancreas, and adrenal glands otherwise normal for technique. Uterus minimally prominent in size but little changed. Unremarkable adnexa with appendix surgically absent by history. Stomach and bowel loops unremarkable for technique. No mass, adenopathy, free fluid, free air or inflammatory process. Osseous structures unremarkable. IMPRESSION: Tiny nonobstructing LEFT renal calculi. Otherwise negative exam. Electronically Signed   By: Ulyses SouthwardMark  Boles M.D.   On: 09/08/2015 16:29    ____________________________________________   PROCEDURES  Procedure(s) performed: None  Critical Care performed: No ____________________________________________   INITIAL IMPRESSION / ASSESSMENT AND PLAN / ED COURSE  Pertinent labs & imaging results that were available during my care of the patient were reviewed by me and considered in my medical decision making (see chart for details).  39 y.o. female with a history of renal stone and renal abscess several months ago presenting with right flank pain, pelvic and right lower quadrant pain with associated nausea and vomiting. The patient is afebrile and otherwise has reassuring vital signs. Her exam is concerning for possible repeat renal colic, consider repeat infection. Her urinalysis is not impressive for UTI or pyelonephritis. If her kidney workup is negative, then I will plan to do a pelvic exam to  evaluate for STD or vaginal infection however she states she has not been essentially active in over a month.  ----------------------------------------- 4:56 PM on 09/08/2015 -----------------------------------------  The patient has tiny nonobstructing right renal calculi on her CT scan. This is unlikely to be the cause of her pain. I will plan to do a  pelvic exam to complete her workup for right flank and pelvic pain.  ----------------------------------------- 5:19 PM on 09/08/2015 -----------------------------------------  Pelvic exam is performed and the patient does have abnormal discharge. She is significantly tender on CMT without significant adnexal tenderness. I'll plan to treat her for a clean for gonorrhea and chlamydia, and await the wet prep for any further abnormal tests. I will plan to transition the patient oral medication for pain control. ____________________________________________  FINAL CLINICAL IMPRESSION(S) / ED DIAGNOSES  Final diagnoses:  Right flank pain  Pelvic pain in female  Bacterial vaginosis  Non-intractable vomiting with nausea, vomiting of unspecified type      NEW MEDICATIONS STARTED DURING THIS VISIT:  Discharge Medication List as of 09/08/2015  6:17 PM    START taking these medications   Details  metroNIDAZOLE (FLAGYL) 500 MG tablet Take 1 tablet (500 mg total) by mouth 2 (two) times daily., Starting 09/08/2015, Until Fri 09/22/15, Print         Rockne Menghini, MD 09/08/15 2053

## 2015-09-08 NOTE — ED Notes (Signed)
Presents with lower back and abd pain with some dysuria

## 2015-12-31 ENCOUNTER — Emergency Department: Payer: BLUE CROSS/BLUE SHIELD

## 2015-12-31 ENCOUNTER — Emergency Department
Admission: EM | Admit: 2015-12-31 | Discharge: 2015-12-31 | Disposition: A | Discharge status: Home / Self Care | Payer: BLUE CROSS/BLUE SHIELD | Attending: Student | Admitting: Student

## 2015-12-31 ENCOUNTER — Encounter: Payer: Self-pay | Admitting: Emergency Medicine

## 2015-12-31 DIAGNOSIS — Z79899 Other long term (current) drug therapy: Secondary | ICD-10-CM | POA: Diagnosis not present

## 2015-12-31 DIAGNOSIS — Z792 Long term (current) use of antibiotics: Secondary | ICD-10-CM | POA: Insufficient documentation

## 2015-12-31 DIAGNOSIS — Z3202 Encounter for pregnancy test, result negative: Secondary | ICD-10-CM | POA: Insufficient documentation

## 2015-12-31 DIAGNOSIS — Z791 Long term (current) use of non-steroidal anti-inflammatories (NSAID): Secondary | ICD-10-CM | POA: Diagnosis not present

## 2015-12-31 DIAGNOSIS — R109 Unspecified abdominal pain: Secondary | ICD-10-CM

## 2015-12-31 DIAGNOSIS — F17210 Nicotine dependence, cigarettes, uncomplicated: Secondary | ICD-10-CM | POA: Insufficient documentation

## 2015-12-31 DIAGNOSIS — N200 Calculus of kidney: Secondary | ICD-10-CM | POA: Diagnosis not present

## 2015-12-31 LAB — CBC
HCT: 39.4 % (ref 35.0–47.0)
Hemoglobin: 13.2 g/dL (ref 12.0–16.0)
MCH: 28.2 pg (ref 26.0–34.0)
MCHC: 33.6 g/dL (ref 32.0–36.0)
MCV: 84 fL (ref 80.0–100.0)
PLATELETS: 299 10*3/uL (ref 150–440)
RBC: 4.69 MIL/uL (ref 3.80–5.20)
RDW: 16.2 % — AB (ref 11.5–14.5)
WBC: 9.8 10*3/uL (ref 3.6–11.0)

## 2015-12-31 LAB — URINALYSIS COMPLETE WITH MICROSCOPIC (ARMC ONLY)
Bilirubin Urine: NEGATIVE
GLUCOSE, UA: NEGATIVE mg/dL
Ketones, ur: NEGATIVE mg/dL
LEUKOCYTES UA: NEGATIVE
Nitrite: NEGATIVE
PROTEIN: NEGATIVE mg/dL
Specific Gravity, Urine: 1.011 (ref 1.005–1.030)
pH: 6 (ref 5.0–8.0)

## 2015-12-31 LAB — BASIC METABOLIC PANEL
Anion gap: 9 (ref 5–15)
BUN: 10 mg/dL (ref 6–20)
CHLORIDE: 103 mmol/L (ref 101–111)
CO2: 21 mmol/L — ABNORMAL LOW (ref 22–32)
CREATININE: 0.79 mg/dL (ref 0.44–1.00)
Calcium: 9 mg/dL (ref 8.9–10.3)
GFR calc Af Amer: >60 mL/min (ref >60)
GFR calc non Af Amer: >60 mL/min (ref >60)
GLUCOSE: 122 mg/dL — AB (ref 65–99)
Potassium: 3.7 mmol/L (ref 3.5–5.1)
SODIUM: 133 mmol/L — AB (ref 135–145)

## 2015-12-31 LAB — POCT PREGNANCY, URINE: Preg Test, Ur: NEGATIVE

## 2015-12-31 LAB — LIPASE, BLOOD: Lipase: 29 U/L (ref 11–51)

## 2015-12-31 MED ORDER — HYDROMORPHONE HCL 1 MG/ML IJ SOLN
1.0000 mg | Freq: Once | INTRAMUSCULAR | Status: AC
  Administered 2015-12-31: 1 mg via INTRAVENOUS
  Filled 2015-12-31: qty 1

## 2015-12-31 MED ORDER — HYDROMORPHONE HCL 1 MG/ML IJ SOLN
INTRAMUSCULAR | Status: AC
  Administered 2015-12-31: 1 mg via INTRAVENOUS
  Filled 2015-12-31: qty 1

## 2015-12-31 MED ORDER — TAMSULOSIN HCL 0.4 MG PO CAPS: 0.4000 mg | ORAL_CAPSULE | Freq: Every day | ORAL | Status: DC

## 2015-12-31 MED ORDER — SODIUM CHLORIDE 0.9 % IV BOLUS (SEPSIS)
1000.0000 mL | Freq: Once | INTRAVENOUS | Status: AC
  Administered 2015-12-31: 1000 mL via INTRAVENOUS

## 2015-12-31 MED ORDER — HYDROMORPHONE HCL 1 MG/ML IJ SOLN
1.0000 mg | Freq: Once | INTRAMUSCULAR | Status: AC
Start: 2015-12-31 — End: 2015-12-31
  Administered 2015-12-31: 1 mg via INTRAVENOUS

## 2015-12-31 MED ORDER — OXYCODONE HCL 5 MG PO TABS: 5.0000 mg | ORAL_TABLET | Freq: Four times a day (QID) | ORAL | Status: DC | PRN

## 2015-12-31 MED ORDER — ONDANSETRON 4 MG PO TBDP: 4.0000 mg | ORAL_TABLET | Freq: Three times a day (TID) | ORAL | Status: DC | PRN

## 2015-12-31 MED ORDER — ONDANSETRON HCL 4 MG/2ML IJ SOLN
4.0000 mg | Freq: Once | INTRAMUSCULAR | Status: AC
  Administered 2015-12-31: 4 mg via INTRAVENOUS
  Filled 2015-12-31: qty 2

## 2015-12-31 NOTE — ED Notes (Signed)
Patient presents to the ED with right flank pain x 3 days that became severe this morning and is radiating into her right lower quadrant.  Patient reports a history of kidney stones and surgery for kidney stones.  Patient states she has not been able to urinate yet this morning.

## 2015-12-31 NOTE — ED Provider Notes (Signed)
[facility] Emergency Department Provider Note  ____________________________________________  Time seen: Approximately 11:08 AM  I have reviewed the triage vital signs and the nursing notes.   HISTORY  Chief Complaint Flank Pain    HPI Torii Royse is a 40 y.o. female with history of right obstructing ureteral stone status post retrieval as well as right renal abscess managed with percutaneous drainage in August 2016 who presents for evaluation of 2-3 days worsening right flank and abdominal pain, gradual onset, constant since onset, severe today, no modifying factors. She is reports she is unable to urinate today. No dysuria. No fevers but she has had chills. She has been nauseated but denies any vomiting or diarrhea. No chest pain or difficulty breathing. No abnormal vaginal bleeding or vaginal discharge.   Past Medical History  Diagnosis Date  . Migraine   . Kidney stones   . Renal abscess, right     Patient Active Problem List   Diagnosis Date Noted  . Sepsis (HCC) 05/27/2015  . Pyelonephritis 05/27/2015  . Renal abscess 05/27/2015  . Ureteral stone with hydronephrosis 05/17/2015    Past Surgical History  Procedure Laterality Date  . Tubal ligation    . Appendectomy    . Cesarean section    . Renal artery stent    . Cystoscopy with retrograde pyelogram, ureteroscopy and stent placement Right 05/17/2015    Procedure: CYSTOSCOPY WITH RETROGRADE PYELOGRAM, URETEROSCOPY AND STENT PLACEMENT;  Surgeon: Jerilee Field, MD;  Location: [facility] ORS;  Service: Urology;  Laterality: Right;  . Ureteroscopy with holmium laser lithotripsy  05/17/2015    Procedure: URETEROSCOPY WITH HOLMIUM LASER LITHOTRIPSY;  Surgeon: Jerilee Field, MD;  Location: [facility] ORS;  Service: Urology;;    Current Outpatient Rx  Name  Route  Sig  Dispense  Refill  . ciprofloxacin (CIPRO) 500 MG tablet   Oral   Take 1 tablet (500 mg total) by mouth 2 (two) times daily.   14  tablet   0   . diazepam (VALIUM) 5 MG tablet   Oral   Take 1 tablet (5 mg total) by mouth every 8 (eight) hours as needed for muscle spasms.   20 tablet   0   . diazepam (VALIUM) 5 MG tablet   Oral   Take 1 tablet (5 mg total) by mouth every 8 (eight) hours as needed for muscle spasms.   20 tablet   0   . docusate sodium (COLACE) 100 MG capsule   Oral   Take 1 capsule (100 mg total) by mouth 2 (two) times daily.   10 capsule   0   . HYDROcodone-acetaminophen (NORCO/VICODIN) 5-325 MG tablet   Oral   Take 1 tablet by mouth every 4 (four) hours as needed.   12 tablet   0   . ibuprofen (ADVIL,MOTRIN) 800 MG tablet   Oral   Take 1 tablet (800 mg total) by mouth every 8 (eight) hours as needed for moderate pain (with food).   20 tablet   0   . naproxen (NAPROSYN) 500 MG tablet   Oral   Take 1 tablet (500 mg total) by mouth 2 (two) times daily with a meal.   60 tablet   2   . nicotine (NICODERM CQ - DOSED IN MG/24 HOURS) 21 mg/24hr patch   Transdermal   Place 1 patch (21 mg total) onto the skin daily. Patient not taking: Reported on 06/02/2015   28 patch   0   . oxyCODONE-acetaminophen (PERCOCET/ROXICET)  5-325 MG tablet   Oral   Take 1 tablet by mouth every 6 (six) hours as needed for severe pain.   6 tablet   0   . silver sulfADIAZINE (SILVADENE) 1 % cream      Apply to affected area daily   50 g   1   . sulfamethoxazole-trimethoprim (BACTRIM DS) 800-160 MG per tablet   Oral   Take 1 tablet by mouth 2 (two) times daily.   6 tablet   0     Allergies Tramadol  Family History  Problem Relation Age of Onset  . CAD    . Hypertension    . Kidney failure Neg Hx   . Hematuria Neg Hx   . Sickle cell trait Neg Hx     Social History Social History  Substance Use Topics  . Smoking status: Current Every Day Smoker -- 0.50 packs/day    Types: Cigarettes  . Smokeless tobacco: Never Used  . Alcohol Use: No    Review of Systems Constitutional: No fever.  +chills Eyes: No visual changes. ENT: No sore throat. Cardiovascular: Denies chest pain. Respiratory: Denies shortness of breath. Gastrointestinal: + abdominal pain.  + nausea, no vomiting.  No diarrhea.  No constipation. Genitourinary: Negative for dysuria. Musculoskeletal: Positive for right flank pain. Skin: Negative for rash. Neurological: Negative for headaches, focal weakness or numbness.  10-point ROS otherwise negative.  ____________________________________________   PHYSICAL EXAM:  VITAL SIGNS: ED Triage Vitals  Enc Vitals Group     BP 12/31/15 1040 161/102 mmHg     Pulse Rate 12/31/15 1040 109     Resp 12/31/15 1040 20     Temp 12/31/15 1040 99 F (37.2 C)     Temp Source 12/31/15 1040 Oral     SpO2 12/31/15 1040 100 %     Weight 12/31/15 1040 140 lb (63.504 kg)     Height 12/31/15 1040  (1.702 m)     Head Cir --      Peak Flow --      Pain Score 12/31/15 1044 10     Pain Loc --      Pain Edu? --      Excl. in GC? --     Constitutional: Alert and oriented. In distress secondary to pain. Eyes: Conjunctivae are normal. PERRL. EOMI. Head: Atraumatic. Nose: No congestion/rhinnorhea. Mouth/Throat: Mucous membranes are moist.  Oropharynx non-erythematous. Neck: No stridor.  Supple without meningismus. Cardiovascular: Normal rate, regular rhythm. Grossly normal heart sounds.  Good peripheral circulation. Respiratory: Normal respiratory effort.  No retractions. Lungs CTAB. Gastrointestinal: Soft with mild tenderness to palpation throughout the right abdomen, no rebound. + right CVA tenderness. Genitourinary: deferred Musculoskeletal: No lower extremity tenderness nor edema.  No joint effusions. Neurologic:  Normal speech and language. No gross focal neurologic deficits are appreciated.  Skin:  Skin is warm, dry and intact. No rash noted. Psychiatric: Mood and affect are normal. Speech and behavior are normal.  ____________________________________________    LABS (all labs ordered are listed, but only abnormal results are displayed)  Labs Reviewed  BASIC METABOLIC PANEL - Abnormal; Notable for the following:    Sodium 133 (*)    CO2 21 (*)    Glucose, Bld 122 (*)    All other components within normal limits  CBC - Abnormal; Notable for the following:    RDW 16.2 (*)    All other components within normal limits  URINALYSIS COMPLETEWITH MICROSCOPIC ([facility] ONLY) - Abnormal; Notable for  the following:    Color, Urine YELLOW (*)    APPearance CLOUDY (*)    Hgb urine dipstick 3+ (*)    Bacteria, UA FEW (*)    Squamous Epithelial / LPF 6-30 (*)    All other components within normal limits  LIPASE, BLOOD  POC URINE PREG, ED  POCT PREGNANCY, URINE   ____________________________________________  EKG  none ____________________________________________  RADIOLOGY  Renal ultrasound IMPRESSION: Bilateral nonobstructing renal calculi measuring up to 4 mm. No hydronephrosis. ____________________________________________   PROCEDURES  Procedure(s) performed: None  Critical Care performed: No  ____________________________________________   INITIAL IMPRESSION / ASSESSMENT AND PLAN / ED COURSE  Pertinent labs & imaging results that were available during my care of the patient were reviewed by me and considered in my medical decision making (see chart for details).  Arman BogusXaviera Bartlett is a 40 y.o. female with history of right obstructing ureteral stone status post retrieval as well as right renal abscess managed with percutaneous drainage in August 2016 who presents for evaluation of 2-3 days worsening right flank and abdominal pain. On exam, she is in distress due to pain. Mildly tachycardic and tachypneic, tearful. She does have right CVA tenderness as well as tenderness throughout the right abdomen without rigidity, rebound or guarding. We'll treat her pain, obtain screening labs, urinalysis and likely renal ultrasound versus CT scan.  Reassess for disposition. Of note, she is a relatively young woman who had 3 CT scans of her abdomen and pelvis performed last year for evaluation of flank and abdominal pain and we will try to avoid ionizing radiation if possible given increased risk of malignancy with increasing/cumulative radiation doses.  ----------------------------------------- 2:57 PM on 12/31/2015 ----------------------------------------- Patient with significant improvement of her pain at this time. She appears much more comfortably. She has urinated several times since arrival to the ER. CBC, BMP generally unremarkable. Pregnancy test is negative. Normal lipase. Urinalysis with numerous red blood cells, few bacteria, negative nitrites and leukocytes, minimal white blood cells, does not appear infected but is concerning for nephrolithiasis. Renal ultrasound obtained which shows bilateral nonobstructing renal calculi measuring up to 4 mm, no hydronephrosis. No evidence for obstructing stone or infected stone. Pain well controlled. Normal creatinine. We discussed return precautions, close urology follow-up and she is comfortable with the discharge plan. DC with expectant management.  ____________________________________________   FINAL CLINICAL IMPRESSION(S) / ED DIAGNOSES  Final diagnoses:  Acute flank pain  Nephrolithiasis      Gayla DossEryka A Chrisangel Eskenazi, MD 12/31/15 1459

## 2015-12-31 NOTE — ED Notes (Signed)
Patient states that she has been having lower back pain for the past few days but the pain got worse over night, this morning around 4:30 the pain became severe. Patient states that she has been unable to urinate this morning. Patient has a hx/o kidney stones that require a stent about a year ago. Patient crying on assessment

## 2015-12-31 NOTE — ED Notes (Signed)
Pt transported to ultrasound.

## 2016-01-03 ENCOUNTER — Emergency Department: Payer: BLUE CROSS/BLUE SHIELD

## 2016-01-03 ENCOUNTER — Encounter: Payer: Self-pay | Admitting: *Deleted

## 2016-01-03 ENCOUNTER — Emergency Department
Admission: EM | Admit: 2016-01-03 | Discharge: 2016-01-03 | Disposition: A | Discharge status: Home / Self Care | Payer: BLUE CROSS/BLUE SHIELD | Attending: Emergency Medicine | Admitting: Emergency Medicine

## 2016-01-03 DIAGNOSIS — R3911 Hesitancy of micturition: Secondary | ICD-10-CM | POA: Diagnosis not present

## 2016-01-03 DIAGNOSIS — F1210 Cannabis abuse, uncomplicated: Secondary | ICD-10-CM | POA: Insufficient documentation

## 2016-01-03 DIAGNOSIS — F1110 Opioid abuse, uncomplicated: Secondary | ICD-10-CM | POA: Diagnosis not present

## 2016-01-03 DIAGNOSIS — F17210 Nicotine dependence, cigarettes, uncomplicated: Secondary | ICD-10-CM | POA: Diagnosis not present

## 2016-01-03 DIAGNOSIS — R300 Dysuria: Secondary | ICD-10-CM | POA: Insufficient documentation

## 2016-01-03 DIAGNOSIS — Z3202 Encounter for pregnancy test, result negative: Secondary | ICD-10-CM | POA: Insufficient documentation

## 2016-01-03 DIAGNOSIS — R1031 Right lower quadrant pain: Secondary | ICD-10-CM | POA: Diagnosis not present

## 2016-01-03 DIAGNOSIS — R112 Nausea with vomiting, unspecified: Secondary | ICD-10-CM | POA: Insufficient documentation

## 2016-01-03 DIAGNOSIS — R109 Unspecified abdominal pain: Secondary | ICD-10-CM | POA: Diagnosis present

## 2016-01-03 DIAGNOSIS — F329 Major depressive disorder, single episode, unspecified: Secondary | ICD-10-CM | POA: Diagnosis not present

## 2016-01-03 LAB — CBC WITH DIFFERENTIAL/PLATELET
BASOS ABS: 0.1 10*3/uL (ref 0–0.1)
BASOS PCT: 1 %
EOS ABS: 0.1 10*3/uL (ref 0–0.7)
Eosinophils Relative: 1 %
HEMATOCRIT: 42.5 % (ref 35.0–47.0)
HEMOGLOBIN: 14 g/dL (ref 12.0–16.0)
Lymphocytes Relative: 19 %
Lymphs Abs: 1.6 10*3/uL (ref 1.0–3.6)
MCH: 27.8 pg (ref 26.0–34.0)
MCHC: 32.9 g/dL (ref 32.0–36.0)
MCV: 84.6 fL (ref 80.0–100.0)
Monocytes Absolute: 0.4 10*3/uL (ref 0.2–0.9)
Monocytes Relative: 4 %
NEUTROS ABS: 6.4 10*3/uL (ref 1.4–6.5)
NEUTROS PCT: 75 %
Platelets: 294 10*3/uL (ref 150–440)
RBC: 5.02 MIL/uL (ref 3.80–5.20)
RDW: 16.6 % — ABNORMAL HIGH (ref 11.5–14.5)
WBC: 8.6 10*3/uL (ref 3.6–11.0)

## 2016-01-03 LAB — URINE DRUG SCREEN, QUALITATIVE (ARMC ONLY)
Amphetamines, Ur Screen: NOT DETECTED
BARBITURATES, UR SCREEN: NOT DETECTED
Benzodiazepine, Ur Scrn: NOT DETECTED
CANNABINOID 50 NG, UR ~~LOC~~: POSITIVE — AB
Cocaine Metabolite,Ur ~~LOC~~: NOT DETECTED
MDMA (Ecstasy)Ur Screen: NOT DETECTED
METHADONE SCREEN, URINE: NOT DETECTED
Opiate, Ur Screen: POSITIVE — AB
Phencyclidine (PCP) Ur S: NOT DETECTED
TRICYCLIC, UR SCREEN: NOT DETECTED

## 2016-01-03 LAB — URINALYSIS COMPLETE WITH MICROSCOPIC (ARMC ONLY)
Bilirubin Urine: NEGATIVE
Glucose, UA: NEGATIVE mg/dL
Ketones, ur: NEGATIVE mg/dL
LEUKOCYTES UA: NEGATIVE
Nitrite: POSITIVE — AB
PROTEIN: 30 mg/dL — AB
SPECIFIC GRAVITY, URINE: 1.023 (ref 1.005–1.030)
pH: 7 (ref 5.0–8.0)

## 2016-01-03 LAB — COMPREHENSIVE METABOLIC PANEL
ALBUMIN: 4.6 g/dL (ref 3.5–5.0)
ALK PHOS: 81 U/L (ref 38–126)
ALT: 10 U/L — AB (ref 14–54)
ANION GAP: 8 (ref 5–15)
AST: 15 U/L (ref 15–41)
BUN: 10 mg/dL (ref 6–20)
CALCIUM: 8.7 mg/dL — AB (ref 8.9–10.3)
CO2: 24 mmol/L (ref 22–32)
Chloride: 103 mmol/L (ref 101–111)
Creatinine, Ser: 0.76 mg/dL (ref 0.44–1.00)
GFR calc Af Amer: >60 mL/min (ref >60)
GFR calc non Af Amer: >60 mL/min (ref >60)
GLUCOSE: 96 mg/dL (ref 65–99)
Potassium: 3.6 mmol/L (ref 3.5–5.1)
SODIUM: 135 mmol/L (ref 135–145)
Total Bilirubin: 0.7 mg/dL (ref 0.3–1.2)
Total Protein: 8.3 g/dL — ABNORMAL HIGH (ref 6.5–8.1)

## 2016-01-03 LAB — HCG, QUANTITATIVE, PREGNANCY: HCG, BETA CHAIN, QUANT, S: 1 m[IU]/mL (ref <5)

## 2016-01-03 LAB — CHLAMYDIA/NGC RT PCR (ARMC ONLY)
CHLAMYDIA TR: NOT DETECTED
N GONORRHOEAE: NOT DETECTED

## 2016-01-03 MED ORDER — KETOROLAC TROMETHAMINE 30 MG/ML IJ SOLN
30.0000 mg | Freq: Once | INTRAMUSCULAR | Status: AC
  Administered 2016-01-03: 30 mg via INTRAVENOUS
  Filled 2016-01-03: qty 1

## 2016-01-03 MED ORDER — SODIUM CHLORIDE 0.9 % IV SOLN
1000.0000 mL | Freq: Once | INTRAVENOUS | Status: AC
  Administered 2016-01-03: 1000 mL via INTRAVENOUS

## 2016-01-03 MED ORDER — HYDROMORPHONE HCL 1 MG/ML IJ SOLN
1.0000 mg | Freq: Once | INTRAMUSCULAR | Status: AC
  Administered 2016-01-03: 1 mg via INTRAVENOUS
  Filled 2016-01-03: qty 1

## 2016-01-03 MED ORDER — LORAZEPAM 2 MG/ML IJ SOLN
1.0000 mg | Freq: Once | INTRAMUSCULAR | Status: AC
  Administered 2016-01-03: 1 mg via INTRAVENOUS
  Filled 2016-01-03: qty 1

## 2016-01-03 MED ORDER — IOPAMIDOL (ISOVUE-300) INJECTION 61%
100.0000 mL | Freq: Once | INTRAVENOUS | Status: AC | PRN
  Administered 2016-01-03: 100 mL via INTRAVENOUS

## 2016-01-03 MED ORDER — DIATRIZOATE MEGLUMINE & SODIUM 66-10 % PO SOLN
15.0000 mL | Freq: Once | ORAL | Status: AC
  Administered 2016-01-03: 15 mL via ORAL

## 2016-01-03 MED ORDER — CEFTRIAXONE SODIUM 1 G IJ SOLR
1.0000 g | Freq: Once | INTRAMUSCULAR | Status: AC
  Administered 2016-01-03: 1 g via INTRAVENOUS
  Filled 2016-01-03: qty 10

## 2016-01-03 MED ORDER — CIPROFLOXACIN HCL 500 MG PO TABS: 500.0000 mg | ORAL_TABLET | Freq: Two times a day (BID) | ORAL | Status: AC

## 2016-01-03 MED ORDER — ONDANSETRON HCL 4 MG/2ML IJ SOLN
4.0000 mg | Freq: Once | INTRAMUSCULAR | Status: AC
  Administered 2016-01-03: 4 mg via INTRAVENOUS
  Filled 2016-01-03: qty 2

## 2016-01-03 MED ORDER — PHENAZOPYRIDINE HCL 200 MG PO TABS
200.0000 mg | ORAL_TABLET | Freq: Once | ORAL | Status: AC
  Administered 2016-01-03: 200 mg via ORAL
  Filled 2016-01-03: qty 1

## 2016-01-03 MED ORDER — OXYCODONE-ACETAMINOPHEN 5-325 MG PO TABS: 2.0000 | ORAL_TABLET | Freq: Four times a day (QID) | ORAL | Status: DC | PRN

## 2016-01-03 MED ORDER — PHENAZOPYRIDINE HCL 200 MG PO TABS: 200.0000 mg | ORAL_TABLET | Freq: Three times a day (TID) | ORAL | Status: AC | PRN

## 2016-01-03 NOTE — ED Notes (Signed)
Pt given sprite 

## 2016-01-03 NOTE — ED Notes (Signed)
Pt was seen Sunday for kidney stones, pt reports feeling worse with increased pain, nausea and vomiting

## 2016-01-03 NOTE — Discharge Instructions (Signed)
Flank Pain °Flank pain refers to pain that is located on the side of the body between the upper abdomen and the back. The pain may occur over a short period of time (acute) or may be long-term or reoccurring (chronic). It may be mild or severe. Flank pain can be caused by many things. °CAUSES  °Some of the more common causes of flank pain include: °· Muscle strains.   °· Muscle spasms.   °· A disease of your spine (vertebral disk disease).   °· A lung infection (pneumonia).   °· Fluid around your lungs (pulmonary edema).   °· A kidney infection.   °· Kidney stones.   °· A very painful skin rash caused by the chickenpox virus (shingles).   °· Gallbladder disease.   °HOME CARE INSTRUCTIONS  °Home care will depend on the cause of your pain. In general, °· Rest as directed by your caregiver. °· Drink enough fluids to keep your urine clear or pale yellow. °· Only take over-the-counter or prescription medicines as directed by your caregiver. Some medicines may help relieve the pain. °· Tell your caregiver about any changes in your pain. °· Follow up with your caregiver as directed. °SEEK IMMEDIATE MEDICAL CARE IF:  °· Your pain is not controlled with medicine.   °· You have new or worsening symptoms. °· Your pain increases.   °· You have abdominal pain.   °· You have shortness of breath.   °· You have persistent nausea or vomiting.   °· You have swelling in your abdomen.   °· You feel faint or pass out.   °· You have blood in your urine. °· You have a fever or persistent symptoms for more than 2-3 days. °· You have a fever and your symptoms suddenly get worse. °MAKE SURE YOU:  °· Understand these instructions. °· Will watch your condition. °· Will get help right away if you are not doing well or get worse. °  °This information is not intended to replace advice given to you by your health care provider. Make sure you discuss any questions you have with your health care provider. °  °Document Released: 11/14/2005 Document  Revised: 06/17/2012 Document Reviewed: 05/07/2012 °Elsevier Interactive Patient Education ©2016 [employer] ° °

## 2016-01-03 NOTE — ED Notes (Signed)
Patient transported to Ultrasound 

## 2016-01-03 NOTE — ED Provider Notes (Signed)
[facility] Emergency Department Provider Note     Time seen: ----------------------------------------- 7:57 AM on 01/03/2016 -----------------------------------------    I have reviewed the triage vital signs and the nursing notes.   HISTORY  Chief Complaint Flank Pain    HPI Morgan Dean is a 40 y.o. female who presents ER for severe right flank pain that radiates into her right groin, increasing pain than when she was here 3 days ago, nausea vomiting. Patient states she is having a hard time urinating, the urine looks dark. She denies fevers, chills, other complaints.   Past Medical History  Diagnosis Date  . Migraine   . Kidney stones   . Renal abscess, right     Patient Active Problem List   Diagnosis Date Noted  . Sepsis (HCC) 05/27/2015  . Pyelonephritis 05/27/2015  . Renal abscess 05/27/2015  . Ureteral stone with hydronephrosis 05/17/2015    Past Surgical History  Procedure Laterality Date  . Tubal ligation    . Appendectomy    . Cesarean section    . Renal artery stent    . Cystoscopy with retrograde pyelogram, ureteroscopy and stent placement Right 05/17/2015    Procedure: CYSTOSCOPY WITH RETROGRADE PYELOGRAM, URETEROSCOPY AND STENT PLACEMENT;  Surgeon: Jerilee FieldMatthew Eskridge, MD;  Location: [facility] ORS;  Service: Urology;  Laterality: Right;  . Ureteroscopy with holmium laser lithotripsy  05/17/2015    Procedure: URETEROSCOPY WITH HOLMIUM LASER LITHOTRIPSY;  Surgeon: Jerilee FieldMatthew Eskridge, MD;  Location: [facility] ORS;  Service: Urology;;    Allergies Tramadol  Social History Social History  Substance Use Topics  . Smoking status: Current Every Day Smoker -- 0.50 packs/day    Types: Cigarettes  . Smokeless tobacco: Never Used  . Alcohol Use: No    Review of Systems Constitutional: Negative for fever. Eyes: Negative for visual changes. ENT: Negative for sore throat. Cardiovascular: Negative for chest pain. Respiratory: Negative for  shortness of breath. Gastrointestinal: Positive for flank pain, vomiting Genitourinary: Positive for dysuria and urinary hesitancy Musculoskeletal: Negative for back pain. Skin: Negative for rash. Neurological: Negative for headaches, focal weakness or numbness.  10-point ROS otherwise negative.  ____________________________________________   PHYSICAL EXAM:  VITAL SIGNS: ED Triage Vitals  Enc Vitals Group     BP 01/03/16 0749 151/105 mmHg     Pulse Rate 01/03/16 0749 110     Resp 01/03/16 0749 20     Temp 01/03/16 0749 98.2 F (36.8 C)     Temp Source 01/03/16 0749 Oral     SpO2 01/03/16 0749 100 %     Weight 01/03/16 0749 140 lb (63.504 kg)     Height 01/03/16 0749 5\' 7"  (1.702 m)     Head Cir --      Peak Flow --      Pain Score 01/03/16 0748 10     Pain Loc --      Pain Edu? --      Excl. in GC? --     Constitutional: Alert and oriented. Mild distress Eyes: Conjunctivae are normal. PERRL. Normal extraocular movements. ENT   Head: Normocephalic and atraumatic.   Nose: No congestion/rhinnorhea.   Mouth/Throat: Mucous membranes are moist.   Neck: No stridor. Cardiovascular: Normal rate, regular rhythm. Normal and symmetric distal pulses are present in all extremities. No murmurs, rubs, or gallops. Respiratory: Normal respiratory effort without tachypnea nor retractions. Breath sounds are clear and equal bilaterally. No wheezes/rales/rhonchi. Gastrointestinal: Right flank, right lower quadrant tenderness. No rebound or guarding. Normal bowel sounds.  Musculoskeletal: Nontender with normal range of motion in all extremities. No joint effusions.  No lower extremity tenderness nor edema. Neurologic:  Normal speech and language. No gross focal neurologic deficits are appreciated.  Skin:  Skin is warm, dry and intact. No rash noted. Psychiatric: Depressed mood and affect ____________________________________________  ED COURSE:  Pertinent labs & imaging results  that were available during my care of the patient were reviewed by me and considered in my medical decision making (see chart for details). Patient is well-known to me, I have seen her for similar symptoms in the past. She does have a history of renal abscess, we will obtain CT scan with contrast and reevaluate. ____________________________________________    LABS (pertinent positives/negatives)  Labs Reviewed  CBC WITH DIFFERENTIAL/PLATELET - Abnormal; Notable for the following:    RDW 16.6 (*)    All other components within normal limits  COMPREHENSIVE METABOLIC PANEL - Abnormal; Notable for the following:    Calcium 8.7 (*)    Total Protein 8.3 (*)    ALT 10 (*)    All other components within normal limits  URINALYSIS COMPLETEWITH MICROSCOPIC ([facility] ONLY) - Abnormal; Notable for the following:    Color, Urine AMBER (*)    APPearance HAZY (*)    Hgb urine dipstick 3+ (*)    Protein, ur 30 (*)    Nitrite POSITIVE (*)    Bacteria, UA RARE (*)    Squamous Epithelial / LPF 6-30 (*)    All other components within normal limits  URINE DRUG SCREEN, QUALITATIVE ([facility] ONLY) - Abnormal; Notable for the following:    Opiate, Ur Screen POSITIVE (*)    Cannabinoid 50 Ng, Ur [place] POSITIVE (*)    All other components within normal limits  CHLAMYDIA/NGC RT PCR ([facility] ONLY)  HCG, QUANTITATIVE, PREGNANCY    RADIOLOGY Images were viewed by me  CT of the abdomen and pelvis with contrast, Pelvic ultrasound  IMPRESSION: Normal reproductive age ultrasound appearance of the pelvis; physiologic 2 cm right adnexal cyst. IMPRESSION: 1. Abnormal right renal lower pole with cortical scarring, but also mildly heterogeneous cortical and medullary enhancement (coronal image 66). There is no perinephric stranding to confirm acute inflammation, but if the patient is febrile consider lobar nephronia. 2. Otherwise negative contrast enhanced appearance of both kidneys and no obstructive uropathy.  The punctate nephrolithiasis seen on the noncontrast study in December is not evident with contrast. 3. Negative CT abdomen and pelvis otherwise. Physiologic appearing right adnexal cyst. ____________________________________________  FINAL ASSESSMENT AND PLAN  Flank pain  Plan: Patient with labs and imaging as dictated above. No clear etiology for her symptoms is identified. I have reviewed the CT scan with the nephrologist who does not feel that perinephric abscess or lobar nephronia this likely. She was given IV Rocephin. She'll be discharged with antibiotics, Pyridium and short supply pain medicine. She is encouraged follow-up with urology for recheck.   Emily Filbert, MD   Emily Filbert, MD 01/03/16 [phone]

## 2016-01-15 IMAGING — US US RENAL
1 series · 14 of 25 positions shown · non-contrast
Comparison: CT abdomen 05/27/2015, 05/17/2015

CLINICAL DATA: Severe right flank pain.

EXAM:
RENAL / URINARY TRACT ULTRASOUND COMPLETE

[Series 1: us renal · 0.26mm/px · 14 of 25 slices shown]
[im 1/25]
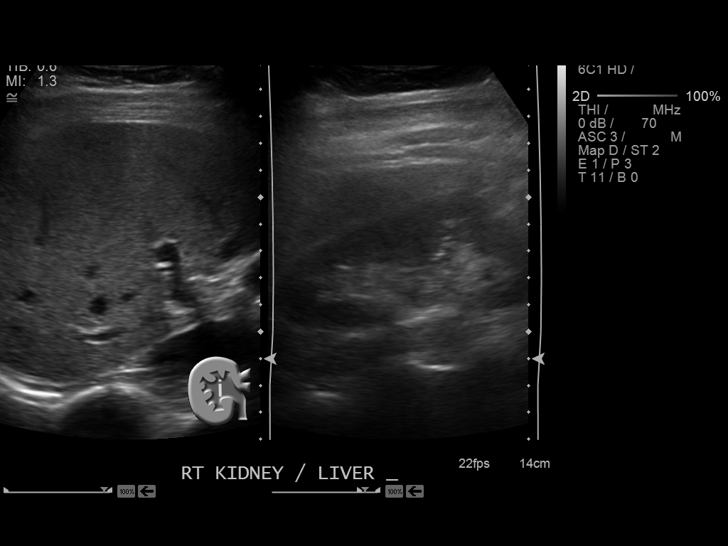
[im 3/25]
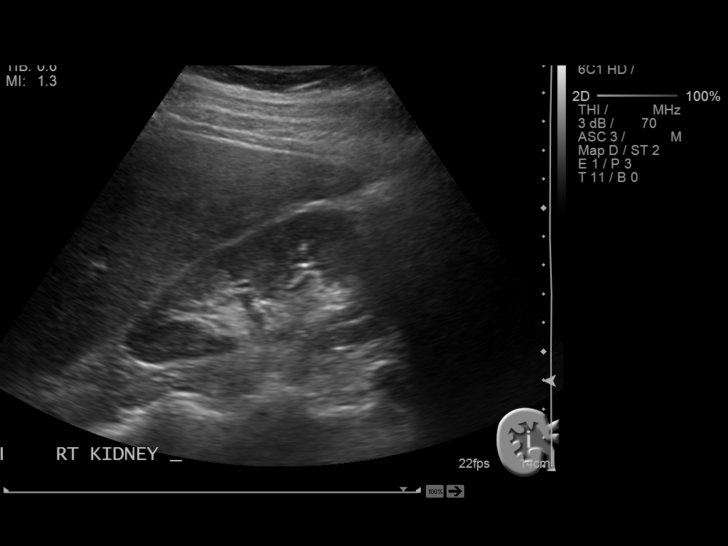
[im 5/25]
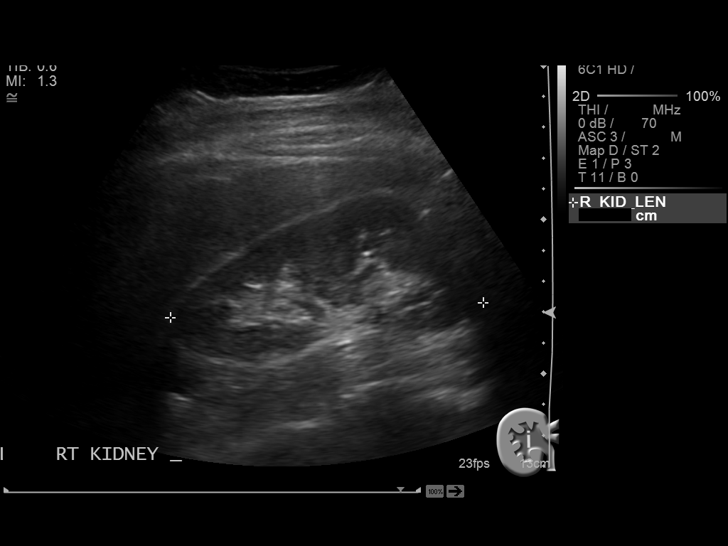
[im 7/25]
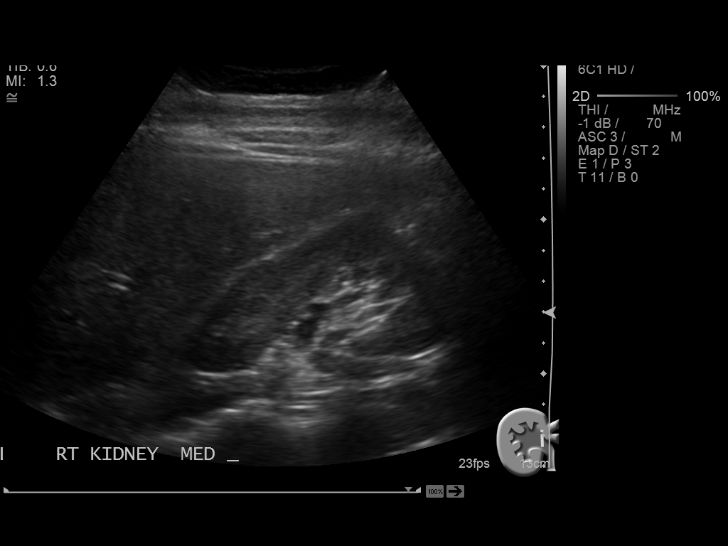
[im 9/25]
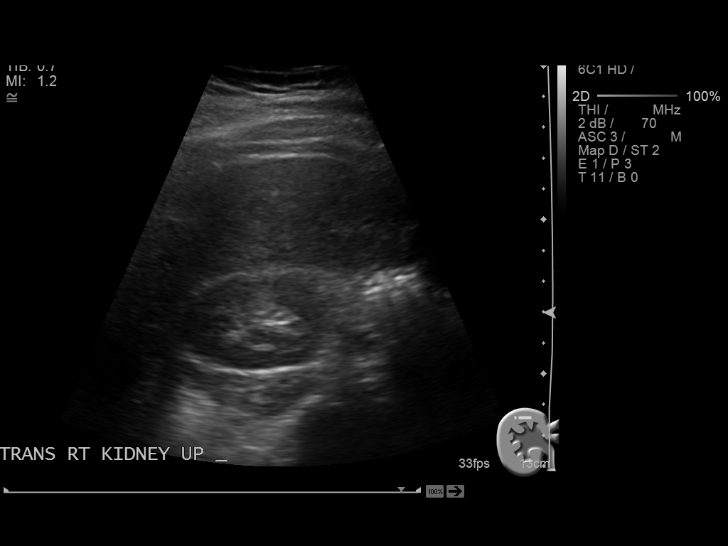
[im 10/25]
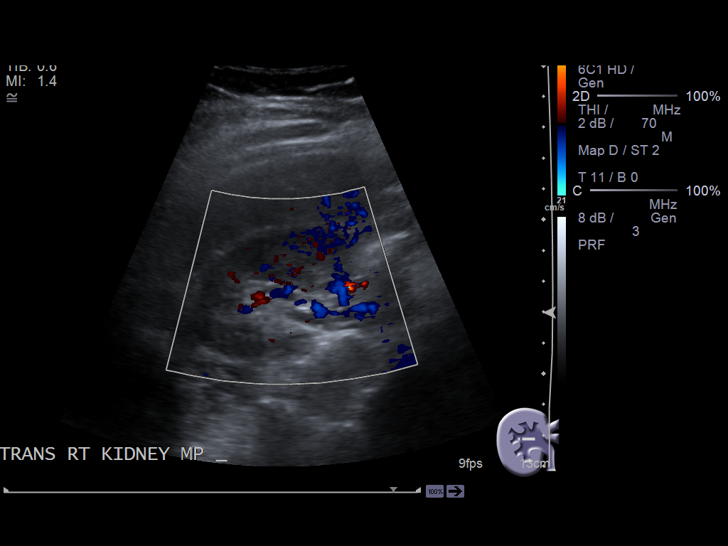
[im 12/25]
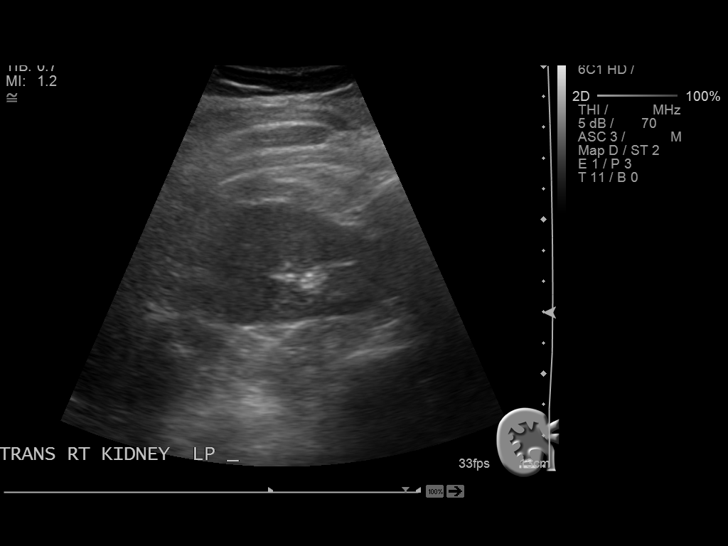
[im 14/25]
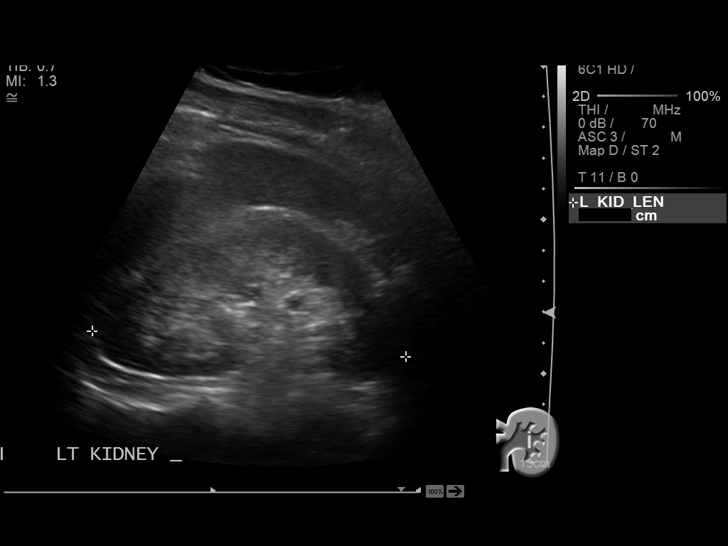
[im 16/25]
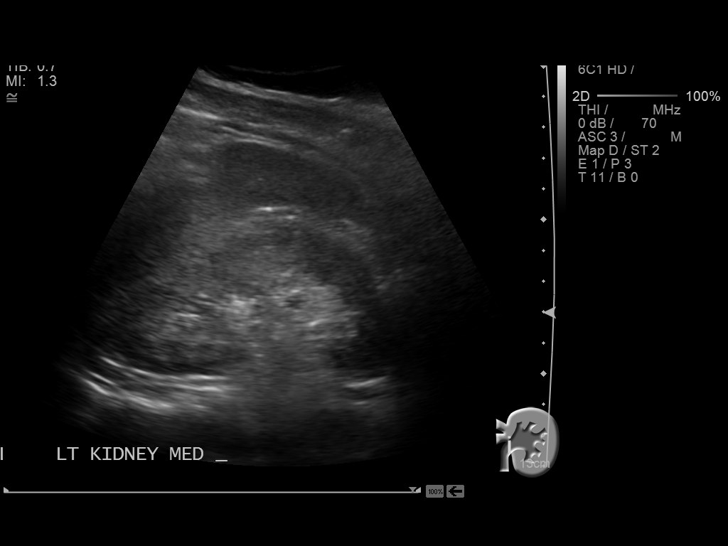
[im 17/25]
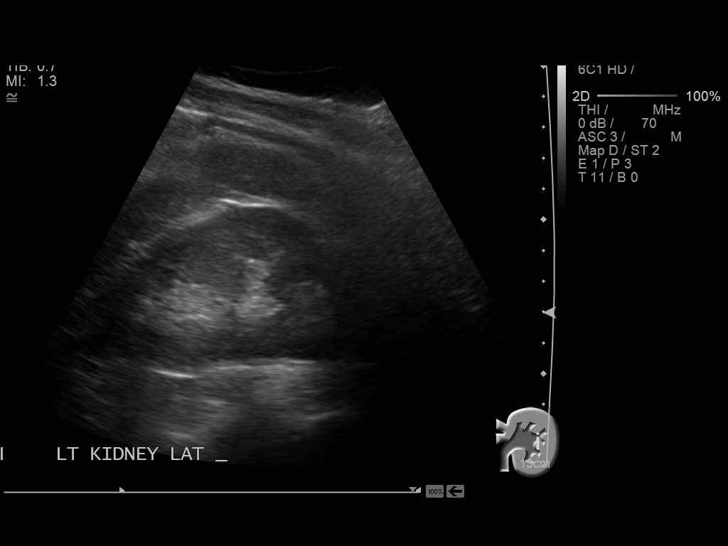
[im 19/25]
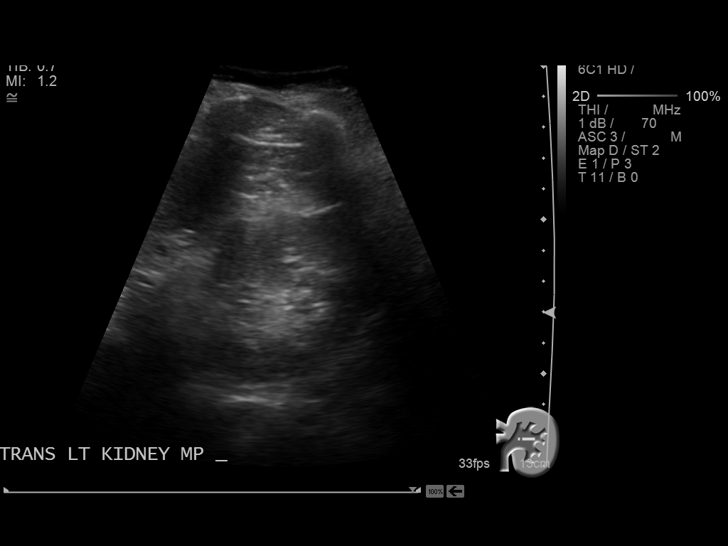
[im 21/25]
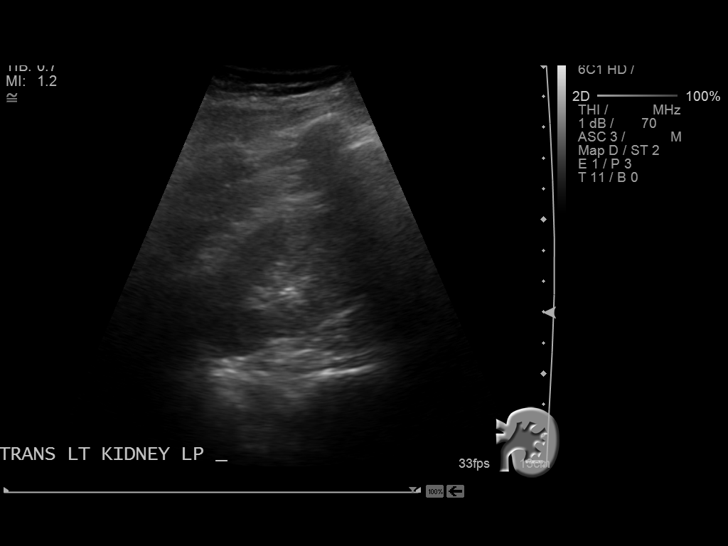
[im 23/25]
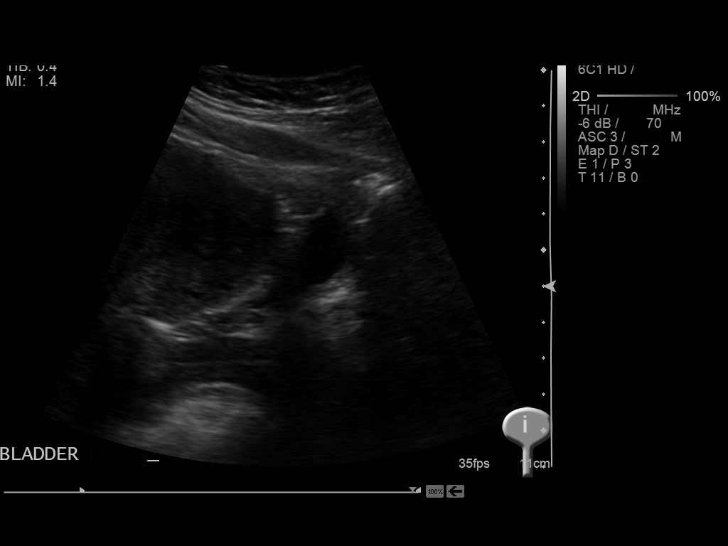
[im 25/25]
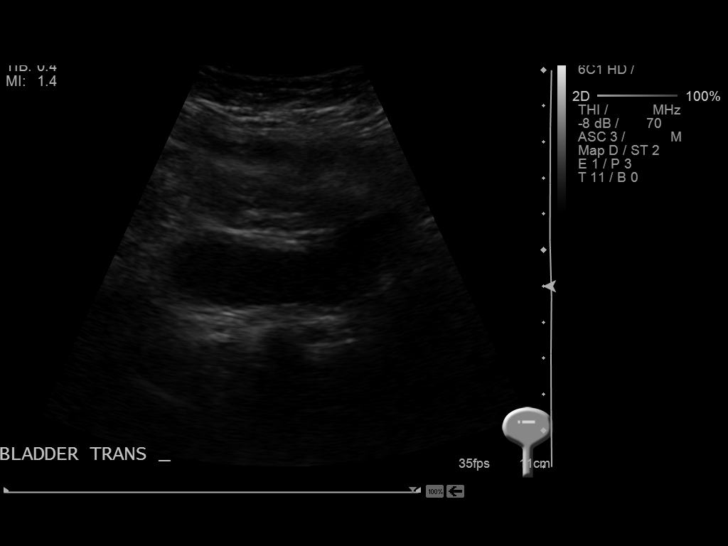

[14 of 25 positions shown; findings below may reference images not displayed]

FINDINGS: Right Kidney:

Length: 10.2 cm. Echogenicity within normal limits. No mass or
hydronephrosis visualized.

Left Kidney:

Length: 10.2 cm. Echogenicity within normal limits. No mass or
hydronephrosis visualized.

Bladder:

Appears normal for degree of bladder distention.
IMPRESSION: Normal bilateral kidneys. No sonographic evidence of residual right
renal abscess.

## 2016-02-15 ENCOUNTER — Emergency Department
Admission: EM | Admit: 2016-02-15 | Discharge: 2016-02-15 | Disposition: A | Discharge status: Home / Self Care | Payer: BLUE CROSS/BLUE SHIELD | Attending: Emergency Medicine | Admitting: Emergency Medicine

## 2016-02-15 ENCOUNTER — Emergency Department: Payer: BLUE CROSS/BLUE SHIELD

## 2016-02-15 DIAGNOSIS — Z792 Long term (current) use of antibiotics: Secondary | ICD-10-CM | POA: Insufficient documentation

## 2016-02-15 DIAGNOSIS — F17210 Nicotine dependence, cigarettes, uncomplicated: Secondary | ICD-10-CM | POA: Insufficient documentation

## 2016-02-15 DIAGNOSIS — Z79899 Other long term (current) drug therapy: Secondary | ICD-10-CM | POA: Diagnosis not present

## 2016-02-15 DIAGNOSIS — R0789 Other chest pain: Secondary | ICD-10-CM | POA: Diagnosis present

## 2016-02-15 DIAGNOSIS — R079 Chest pain, unspecified: Secondary | ICD-10-CM

## 2016-02-15 DIAGNOSIS — Z791 Long term (current) use of non-steroidal anti-inflammatories (NSAID): Secondary | ICD-10-CM | POA: Diagnosis not present

## 2016-02-15 LAB — BASIC METABOLIC PANEL
Anion gap: 14 (ref 5–15)
BUN: 14 mg/dL (ref 6–20)
CHLORIDE: 101 mmol/L (ref 101–111)
CO2: 20 mmol/L — AB (ref 22–32)
CREATININE: 0.82 mg/dL (ref 0.44–1.00)
Calcium: 9.7 mg/dL (ref 8.9–10.3)
GFR calc Af Amer: >60 mL/min (ref >60)
GFR calc non Af Amer: >60 mL/min (ref >60)
GLUCOSE: 111 mg/dL — AB (ref 65–99)
Potassium: 3.1 mmol/L — ABNORMAL LOW (ref 3.5–5.1)
SODIUM: 135 mmol/L (ref 135–145)

## 2016-02-15 LAB — CBC
HCT: 39.4 % (ref 35.0–47.0)
Hemoglobin: 13.3 g/dL (ref 12.0–16.0)
MCH: 28.1 pg (ref 26.0–34.0)
MCHC: 33.8 g/dL (ref 32.0–36.0)
MCV: 83.2 fL (ref 80.0–100.0)
PLATELETS: 342 10*3/uL (ref 150–440)
RBC: 4.73 MIL/uL (ref 3.80–5.20)
RDW: 15.5 % — AB (ref 11.5–14.5)
WBC: 13.3 10*3/uL — ABNORMAL HIGH (ref 3.6–11.0)

## 2016-02-15 LAB — FIBRIN DERIVATIVES D-DIMER (ARMC ONLY): Fibrin derivatives D-dimer (ARMC): 312 (ref 0–499)

## 2016-02-15 LAB — TROPONIN I: Troponin I: <0.03 ng/mL (ref <0.031)

## 2016-02-15 MED ORDER — LIDOCAINE 5 % EX PTCH: 1.0000 | MEDICATED_PATCH | CUTANEOUS | Status: AC

## 2016-02-15 MED ORDER — METOCLOPRAMIDE HCL 10 MG PO TABS: 10.0000 mg | ORAL_TABLET | Freq: Three times a day (TID) | ORAL | Status: DC | PRN

## 2016-02-15 MED ORDER — LORAZEPAM 2 MG/ML IJ SOLN
1.0000 mg | Freq: Once | INTRAMUSCULAR | Status: AC
  Administered 2016-02-15: 1 mg via INTRAVENOUS
  Filled 2016-02-15: qty 1

## 2016-02-15 MED ORDER — POTASSIUM CHLORIDE CRYS ER 20 MEQ PO TBCR
20.0000 meq | EXTENDED_RELEASE_TABLET | Freq: Once | ORAL | Status: AC
  Administered 2016-02-15: 20 meq via ORAL
  Filled 2016-02-15: qty 1

## 2016-02-15 MED ORDER — LORAZEPAM 1 MG PO TABS: 1.0000 mg | ORAL_TABLET | Freq: Two times a day (BID) | ORAL | Status: DC | PRN

## 2016-02-15 MED ORDER — KETOROLAC TROMETHAMINE 30 MG/ML IJ SOLN
15.0000 mg | Freq: Once | INTRAMUSCULAR | Status: AC
Start: 2016-02-15 — End: 2016-02-15
  Administered 2016-02-15: 15 mg via INTRAVENOUS
  Filled 2016-02-15: qty 1

## 2016-02-15 NOTE — ED Notes (Signed)
Pt was at work when she began to have sharp chest pain, pt tearful on assessment

## 2016-02-15 NOTE — ED Provider Notes (Signed)
Time Seen: Approximately 0 9:30 I have reviewed the triage notes  Chief Complaint: Chest Pain   History of Present Illness: Morgan Dean is a 40 y.o. female who presents with onset of chest discomfort that started today while the patient was at work. She states pain is worse with movement and she admits to feeling very anxious. She denies any shortness of breath occasionally does hurt to take a deep breath. She denies any calf tenderness or swelling or history of DVT or pulmonary embolism. She denies any travel. Does smoke. She denies any substernal chest discomfort. Mild nausea with no persistent vomiting. She denies any fever or has had an occasional dry nonproductive cough. She denies any arm, jaw, midline back pain. She points mainly to the left upper area of her chest as the area of concern.   Past Medical History  Diagnosis Date  . Migraine   . Kidney stones   . Renal abscess, right     Patient Active Problem List   Diagnosis Date Noted  . Sepsis (HCC) 05/27/2015  . Pyelonephritis 05/27/2015  . Renal abscess 05/27/2015  . Ureteral stone with hydronephrosis 05/17/2015    Past Surgical History  Procedure Laterality Date  . Tubal ligation    . Appendectomy    . Cesarean section    . Renal artery stent    . Cystoscopy with retrograde pyelogram, ureteroscopy and stent placement Right 05/17/2015    Procedure: CYSTOSCOPY WITH RETROGRADE PYELOGRAM, URETEROSCOPY AND STENT PLACEMENT;  Surgeon: Jerilee Field, MD;  Location: [facility] ORS;  Service: Urology;  Laterality: Right;  . Ureteroscopy with holmium laser lithotripsy  05/17/2015    Procedure: URETEROSCOPY WITH HOLMIUM LASER LITHOTRIPSY;  Surgeon: Jerilee Field, MD;  Location: [facility];  Service: Urology;;    Past Surgical History  Procedure Laterality Date  . Tubal ligation    . Appendectomy    . Cesarean section    . Renal artery stent    . Cystoscopy with retrograde pyelogram, ureteroscopy and stent placement Right  05/17/2015    Procedure: CYSTOSCOPY WITH RETROGRADE PYELOGRAM, URETEROSCOPY AND STENT PLACEMENT;  Surgeon: Jerilee Field, MD;  Location: [facility];  Service: Urology;  Laterality: Right;  . Ureteroscopy with holmium laser lithotripsy  05/17/2015    Procedure: URETEROSCOPY WITH HOLMIUM LASER LITHOTRIPSY;  Surgeon: Jerilee Field, MD;  Location: [facility];  Service: Urology;;    Current Outpatient Rx  Name  Route  Sig  Dispense  Refill  . ciprofloxacin (CIPRO) 500 MG tablet   Oral   Take 1 tablet (500 mg total) by mouth 2 (two) times daily.   14 tablet   0   . diazepam (VALIUM) 5 MG tablet   Oral   Take 1 tablet (5 mg total) by mouth every 8 (eight) hours as needed for muscle spasms.   20 tablet   0   . diazepam (VALIUM) 5 MG tablet   Oral   Take 1 tablet (5 mg total) by mouth every 8 (eight) hours as needed for muscle spasms.   20 tablet   0   . docusate sodium (COLACE) 100 MG capsule   Oral   Take 1 capsule (100 mg total) by mouth 2 (two) times daily.   10 capsule   0   . HYDROcodone-acetaminophen (NORCO/VICODIN) 5-325 MG tablet   Oral   Take 1 tablet by mouth every 4 (four) hours as needed.   12 tablet   0   . ibuprofen (ADVIL,MOTRIN) 800 MG tablet  Oral   Take 1 tablet (800 mg total) by mouth every 8 (eight) hours as needed for moderate pain (with food).   20 tablet   0   . lidocaine (LIDODERM) 5 %   Transdermal   Place 1 patch onto the skin daily. Remove & Discard patch within 24 hours or as directed by MD   6 patch   0   . LORazepam (ATIVAN) 1 MG tablet   Oral   Take 1 tablet (1 mg total) by mouth 2 (two) times daily as needed for anxiety.   10 tablet   0   . metoCLOPramide (REGLAN) 10 MG tablet   Oral   Take 1 tablet (10 mg total) by mouth every 8 (eight) hours as needed (migraine).   30 tablet   0   . naproxen (NAPROSYN) 500 MG tablet   Oral   Take 1 tablet (500 mg total) by mouth 2 (two) times daily with a meal.   60 tablet   2   . nicotine  (NICODERM CQ - DOSED IN MG/24 HOURS) 21 mg/24hr patch   Transdermal   Place 1 patch (21 mg total) onto the skin daily. Patient not taking: Reported on 06/02/2015   28 patch   0   . ondansetron (ZOFRAN ODT) 4 MG disintegrating tablet   Oral   Take 1 tablet (4 mg total) by mouth every 8 (eight) hours as needed for nausea or vomiting.   12 tablet   0   . oxyCODONE (ROXICODONE) 5 MG immediate release tablet   Oral   Take 1 tablet (5 mg total) by mouth every 6 (six) hours as needed for moderate pain. Do not drive while taking this medication.   12 tablet   0   . oxyCODONE-acetaminophen (PERCOCET) 5-325 MG tablet   Oral   Take 2 tablets by mouth every 6 (six) hours as needed for moderate pain or severe pain.   30 tablet   0   . oxyCODONE-acetaminophen (PERCOCET/ROXICET) 5-325 MG tablet   Oral   Take 1 tablet by mouth every 6 (six) hours as needed for severe pain.   6 tablet   0   . phenazopyridine (PYRIDIUM) 200 MG tablet   Oral   Take 1 tablet (200 mg total) by mouth 3 (three) times daily as needed for pain.   20 tablet   0   . silver sulfADIAZINE (SILVADENE) 1 % cream      Apply to affected area daily   50 g   1   . sulfamethoxazole-trimethoprim (BACTRIM DS) 800-160 MG per tablet   Oral   Take 1 tablet by mouth 2 (two) times daily.   6 tablet   0   . tamsulosin (FLOMAX) 0.4 MG CAPS capsule   Oral   Take 1 capsule (0.4 mg total) by mouth daily.   14 capsule   0     Allergies:  Tramadol  Family History: Family History  Problem Relation Age of Onset  . CAD    . Hypertension    . Kidney failure Neg Hx   . Hematuria Neg Hx   . Sickle cell trait Neg Hx     Social History: Social History  Substance Use Topics  . Smoking status: Current Every Day Smoker -- 0.50 packs/day    Types: Cigarettes  . Smokeless tobacco: Never Used  . Alcohol Use: No     Review of Systems:   10 point review of systems was performed and  was otherwise  negative:  Constitutional: No fever Eyes: No visual disturbances ENT: No sore throat, ear pain Cardiac: No chest pain Respiratory:Mild shortness of breath, wheezing, or stridor Abdomen: No abdominal pain, no vomiting, No diarrhea Endocrine: No weight loss, No night sweats Extremities: No peripheral edema, cyanosis Skin: No rashes, easy bruising Neurologic: No focal weakness, trouble with speech or swollowing Urologic: No dysuria, Hematuria, or urinary frequency   Physical Exam:  ED Triage Vitals  Enc Vitals Group     BP 02/15/16 0913 156/103 mmHg     Pulse Rate 02/15/16 0913 115     Resp 02/15/16 0913 24     Temp 02/15/16 0909 98.1 F (36.7 C)     Temp Source 02/15/16 0909 Oral     SpO2 02/15/16 0913 98 %     Weight 02/15/16 0909 130 lb (58.968 kg)     Height 02/15/16 0909 5\' 6"  (1.676 m)     Head Cir --      Peak Flow --      Pain Score 02/15/16 0909 10     Pain Loc --      Pain Edu? --      Excl. in GC? --     General: Awake , Alert , and Oriented times 3; GCS 15Patient is very anxious, upset. Mild hyperventilation Head: Normal cephalic , atraumatic Eyes: Pupils equal , round, reactive to light Nose/Throat: No nasal drainage, patent upper airway without erythema or exudate.  Neck: Supple, Full range of motion, No anterior adenopathy or palpable thyroid masses Lungs: Clear to ascultation without wheezes , rhonchi, or rales Heart: Regular rate, regular rhythm without murmurs , gallops , or rubs Abdomen: Soft, non tender without rebound, guarding , or rigidity; bowel sounds positive and symmetric in all 4 quadrants. No organomegaly .        Extremities: 2 plus symmetric pulses. No edema, clubbing or cyanosis Neurologic: normal ambulation, Motor symmetric without deficits, sensory intact Skin: warm, dry, no rashes Pain is reproducible with palpation across the left upper chest wall region without crepitus or step-off noted palpation does reproduce the patient's  discomfort  Labs:   All laboratory work was reviewed including any pertinent negatives or positives listed below:  Labs Reviewed  BASIC METABOLIC PANEL - Abnormal; Notable for the following:    Potassium 3.1 (*)    CO2 20 (*)    Glucose, Bld 111 (*)    All other components within normal limits  CBC - Abnormal; Notable for the following:    WBC 13.3 (*)    RDW 15.5 (*)    All other components within normal limits  TROPONIN I  FIBRIN DERIVATIVES D-DIMER ([facility] ONLY)  Patient's review of her laboratory work shows no significant findings  EKG: * ED ECG REPORT I, Jennye MoccasinBrian S Sung [place], the attending physician, personally viewed and interpreted this ECG.  Date: 02/15/2016 EKG Time: 0 911 poor quality EKG Rate: 118 Rhythm: Sinus tachycardia QRS Axis: normal Intervals: normal ST/T Wave abnormalities: normal Conduction Disturbances: none Narrative Interpretation: unremarkable No acute ischemic changes are noted   Radiology:   CLINICAL DATA: Chest pain and shortness of Breath  EXAM: CHEST 2 VIEW  COMPARISON: 08/26/2014  FINDINGS: The heart size and mediastinal contours are within normal limits. Both lungs are clear. The visualized skeletal structures are unremarkable.  IMPRESSION: No active cardiopulmonary disease.   Electronically Signed By: Alcide CleverMark Lukens M.D.    I personally reviewed the radiologic studies     ED Course:  Differential includes all life-threatening causes for chest pain. This includes but is not exclusive to acute coronary syndrome, aortic dissection, pulmonary embolism, cardiac tamponade, community-acquired pneumonia, pericarditis, musculoskeletal chest wall pain, etc.  Repeat evaluation the patient shows improvement of her chest discomfort however she states she started developing migraine-type symptoms. The patient's laboratory work and x-ray findings were reviewed with her and I felt this was unlikely to be a life-threatening cause for chest  pain. Given the reproducible discomfort I felt it was musculoskeletal associated with some anxiety. Patient did have relief of her discomfort with Toradol and Ativan. She states she feels like she is getting a typical migraine headache and requested further pain medication. I did prescribe her an outpatient basis lidocaine patch along with Ativan and Reglan. She's been advised drink plenty of fluids and I felt workup for her headache was not necessary at this point. Patient admits to something at home that has triggered some anxiety but did not elaborate on any further details. She states she does not have a primary physician know has insurance I referred her to the local clinic.*    Assessment:  Acute unspecified chest pain   Final Clinical Impression: *  Final diagnoses:  Chest pain, unspecified chest pain type     Plan: * Outpatient Patient was advised to return immediately if condition worsens. Patient was advised to follow up with their primary care physician or other specialized physicians involved in their outpatient care. The patient and/or family member/power of attorney had laboratory results reviewed at the bedside. All questions and concerns were addressed and appropriate discharge instructions were distributed by the nursing staff.            Jennye Moccasin, MD 02/15/16 [phone]

## 2016-03-31 ENCOUNTER — Encounter: Payer: Self-pay | Admitting: Emergency Medicine

## 2016-03-31 ENCOUNTER — Emergency Department
Admission: EM | Admit: 2016-03-31 | Discharge: 2016-03-31 | Disposition: A | Discharge status: Home / Self Care | Payer: BLUE CROSS/BLUE SHIELD | Attending: Student | Admitting: Student

## 2016-03-31 ENCOUNTER — Other Ambulatory Visit: Payer: Self-pay

## 2016-03-31 ENCOUNTER — Emergency Department: Payer: BLUE CROSS/BLUE SHIELD

## 2016-03-31 DIAGNOSIS — M549 Dorsalgia, unspecified: Secondary | ICD-10-CM | POA: Insufficient documentation

## 2016-03-31 DIAGNOSIS — J069 Acute upper respiratory infection, unspecified: Secondary | ICD-10-CM | POA: Insufficient documentation

## 2016-03-31 DIAGNOSIS — N3289 Other specified disorders of bladder: Secondary | ICD-10-CM | POA: Insufficient documentation

## 2016-03-31 DIAGNOSIS — Z79899 Other long term (current) drug therapy: Secondary | ICD-10-CM | POA: Insufficient documentation

## 2016-03-31 DIAGNOSIS — R1030 Lower abdominal pain, unspecified: Secondary | ICD-10-CM | POA: Diagnosis present

## 2016-03-31 DIAGNOSIS — Z791 Long term (current) use of non-steroidal anti-inflammatories (NSAID): Secondary | ICD-10-CM | POA: Diagnosis not present

## 2016-03-31 DIAGNOSIS — R300 Dysuria: Secondary | ICD-10-CM | POA: Diagnosis not present

## 2016-03-31 DIAGNOSIS — F17210 Nicotine dependence, cigarettes, uncomplicated: Secondary | ICD-10-CM | POA: Diagnosis not present

## 2016-03-31 LAB — COMPREHENSIVE METABOLIC PANEL
ALK PHOS: 68 U/L (ref 38–126)
ALT: 11 U/L — ABNORMAL LOW (ref 14–54)
ANION GAP: 10 (ref 5–15)
AST: 18 U/L (ref 15–41)
Albumin: 3.9 g/dL (ref 3.5–5.0)
BUN: 11 mg/dL (ref 6–20)
CALCIUM: 8.8 mg/dL — AB (ref 8.9–10.3)
CO2: 21 mmol/L — AB (ref 22–32)
Chloride: 104 mmol/L (ref 101–111)
Creatinine, Ser: 0.61 mg/dL (ref 0.44–1.00)
GFR calc non Af Amer: >60 mL/min (ref >60)
Glucose, Bld: 126 mg/dL — ABNORMAL HIGH (ref 65–99)
Potassium: 3.2 mmol/L — ABNORMAL LOW (ref 3.5–5.1)
SODIUM: 135 mmol/L (ref 135–145)
TOTAL PROTEIN: 7.2 g/dL (ref 6.5–8.1)
Total Bilirubin: 0.2 mg/dL — ABNORMAL LOW (ref 0.3–1.2)

## 2016-03-31 LAB — CBC WITH DIFFERENTIAL/PLATELET
Basophils Absolute: 0.2 10*3/uL — ABNORMAL HIGH (ref 0–0.1)
Basophils Relative: 1 %
EOS ABS: 0.3 10*3/uL (ref 0–0.7)
HCT: 34.7 % — ABNORMAL LOW (ref 35.0–47.0)
HEMOGLOBIN: 11.5 g/dL — AB (ref 12.0–16.0)
LYMPHS ABS: 2.1 10*3/uL (ref 1.0–3.6)
Lymphocytes Relative: 14 %
MCH: 28.2 pg (ref 26.0–34.0)
MCHC: 33.2 g/dL (ref 32.0–36.0)
MCV: 84.9 fL (ref 80.0–100.0)
MONO ABS: 0.7 10*3/uL (ref 0.2–0.9)
Neutro Abs: 11.6 10*3/uL — ABNORMAL HIGH (ref 1.4–6.5)
Platelets: 250 10*3/uL (ref 150–440)
RBC: 4.09 MIL/uL (ref 3.80–5.20)
RDW: 16.3 % — ABNORMAL HIGH (ref 11.5–14.5)
WBC: 14.9 10*3/uL — ABNORMAL HIGH (ref 3.6–11.0)

## 2016-03-31 LAB — POCT RAPID STREP A: Streptococcus, Group A Screen (Direct): NEGATIVE

## 2016-03-31 LAB — LACTIC ACID, PLASMA: Lactic Acid, Venous: 0.7 mmol/L (ref 0.5–2.0)

## 2016-03-31 LAB — LIPASE, BLOOD: Lipase: 31 U/L (ref 11–51)

## 2016-03-31 LAB — POCT PREGNANCY, URINE: PREG TEST UR: NEGATIVE

## 2016-03-31 MED ORDER — HYDROMORPHONE HCL 1 MG/ML IJ SOLN
1.0000 mg | Freq: Once | INTRAMUSCULAR | Status: AC
  Administered 2016-03-31: 1 mg via INTRAVENOUS
  Filled 2016-03-31: qty 1

## 2016-03-31 MED ORDER — ONDANSETRON HCL 4 MG/2ML IJ SOLN
4.0000 mg | Freq: Once | INTRAMUSCULAR | Status: AC
  Administered 2016-03-31: 4 mg via INTRAVENOUS
  Filled 2016-03-31: qty 2

## 2016-03-31 MED ORDER — SODIUM CHLORIDE 0.9 % IV BOLUS (SEPSIS)
1000.0000 mL | Freq: Once | INTRAVENOUS | Status: AC
  Administered 2016-03-31: 1000 mL via INTRAVENOUS

## 2016-03-31 MED ORDER — PHENAZOPYRIDINE HCL 200 MG PO TABS: 200.0000 mg | ORAL_TABLET | Freq: Three times a day (TID) | ORAL | Status: AC | PRN

## 2016-03-31 MED ORDER — NAPROXEN 500 MG PO TABS: 500.0000 mg | ORAL_TABLET | Freq: Two times a day (BID) | ORAL | Status: AC

## 2016-03-31 NOTE — ED Provider Notes (Signed)
[facility] Emergency Department Provider Note   ____________________________________________  Time seen: Approximately 6:19 PM  I have reviewed the triage vital signs and the nursing notes.   HISTORY  Chief Complaint Abdominal Pain and Back Pain    HPI Morgan Dean is a 40 y.o. female with history of obstructing ureteral stones, renal abscess managed with percutaneous drainage in August 2016, migraines, no other chronic medical problems who presents for evaluation of 5 days of suprapubic abdominal pain radiating to the middle of her back, gradual onset, constant, no modifying factors, moderate to severe. She has also had severe dysuria. She reports she has had low-grade fevers at home with a maximum temperature 100.7. She has developed cough, sneezing, runny nose, chest pain with cough over the past several days which she is trying to treat with over-the-counter cough and cold medications. No vomiting or diarrhea. No shortness of breath. Her back pain has no laterality and she reports this does not feel like a kidney stone or kidney infection. No bowel or bladder incontinence, no saddle anesthesia, no numbness or weakness in the legs. She denies any abnormal vaginal bleeding or vaginal discharge, she denies concern for sexual transmitted infection.   Past Medical History  Diagnosis Date  . Migraine   . Kidney stones   . Renal abscess, right     Patient Active Problem List   Diagnosis Date Noted  . Sepsis (HCC) 05/27/2015  . Pyelonephritis 05/27/2015  . Renal abscess 05/27/2015  . Ureteral stone with hydronephrosis 05/17/2015    Past Surgical History  Procedure Laterality Date  . Tubal ligation    . Appendectomy    . Cesarean section    . Renal artery stent    . Cystoscopy with retrograde pyelogram, ureteroscopy and stent placement Right 05/17/2015    Procedure: CYSTOSCOPY WITH RETROGRADE PYELOGRAM, URETEROSCOPY AND STENT PLACEMENT;  Surgeon:  Jerilee Field, MD;  Location: [facility] ORS;  Service: Urology;  Laterality: Right;  . Ureteroscopy with holmium laser lithotripsy  05/17/2015    Procedure: URETEROSCOPY WITH HOLMIUM LASER LITHOTRIPSY;  Surgeon: Jerilee Field, MD;  Location: [facility] ORS;  Service: Urology;;    Current Outpatient Rx  Name  Route  Sig  Dispense  Refill  . ciprofloxacin (CIPRO) 500 MG tablet   Oral   Take 1 tablet (500 mg total) by mouth 2 (two) times daily.   14 tablet   0   . diazepam (VALIUM) 5 MG tablet   Oral   Take 1 tablet (5 mg total) by mouth every 8 (eight) hours as needed for muscle spasms.   20 tablet   0   . diazepam (VALIUM) 5 MG tablet   Oral   Take 1 tablet (5 mg total) by mouth every 8 (eight) hours as needed for muscle spasms.   20 tablet   0   . docusate sodium (COLACE) 100 MG capsule   Oral   Take 1 capsule (100 mg total) by mouth 2 (two) times daily.   10 capsule   0   . HYDROcodone-acetaminophen (NORCO/VICODIN) 5-325 MG tablet   Oral   Take 1 tablet by mouth every 4 (four) hours as needed.   12 tablet   0   . ibuprofen (ADVIL,MOTRIN) 800 MG tablet   Oral   Take 1 tablet (800 mg total) by mouth every 8 (eight) hours as needed for moderate pain (with food).   20 tablet   0   . lidocaine (LIDODERM) 5 %  Transdermal   Place 1 patch onto the skin daily. Remove & Discard patch within 24 hours or as directed by MD   6 patch   0   . LORazepam (ATIVAN) 1 MG tablet   Oral   Take 1 tablet (1 mg total) by mouth 2 (two) times daily as needed for anxiety.   10 tablet   0   . metoCLOPramide (REGLAN) 10 MG tablet   Oral   Take 1 tablet (10 mg total) by mouth every 8 (eight) hours as needed (migraine).   30 tablet   0   . naproxen (NAPROSYN) 500 MG tablet   Oral   Take 1 tablet (500 mg total) by mouth 2 (two) times daily with a meal.   60 tablet   2   . nicotine (NICODERM CQ - DOSED IN MG/24 HOURS) 21 mg/24hr patch   Transdermal   Place 1 patch (21 mg total) onto  the skin daily. Patient not taking: Reported on 06/02/2015   28 patch   0   . ondansetron (ZOFRAN ODT) 4 MG disintegrating tablet   Oral   Take 1 tablet (4 mg total) by mouth every 8 (eight) hours as needed for nausea or vomiting.   12 tablet   0   . oxyCODONE (ROXICODONE) 5 MG immediate release tablet   Oral   Take 1 tablet (5 mg total) by mouth every 6 (six) hours as needed for moderate pain. Do not drive while taking this medication.   12 tablet   0   . oxyCODONE-acetaminophen (PERCOCET) 5-325 MG tablet   Oral   Take 2 tablets by mouth every 6 (six) hours as needed for moderate pain or severe pain.   30 tablet   0   . oxyCODONE-acetaminophen (PERCOCET/ROXICET) 5-325 MG tablet   Oral   Take 1 tablet by mouth every 6 (six) hours as needed for severe pain.   6 tablet   0   . phenazopyridine (PYRIDIUM) 200 MG tablet   Oral   Take 1 tablet (200 mg total) by mouth 3 (three) times daily as needed for pain.   20 tablet   0   . silver sulfADIAZINE (SILVADENE) 1 % cream      Apply to affected area daily   50 g   1   . sulfamethoxazole-trimethoprim (BACTRIM DS) 800-160 MG per tablet   Oral   Take 1 tablet by mouth 2 (two) times daily.   6 tablet   0   . tamsulosin (FLOMAX) 0.4 MG CAPS capsule   Oral   Take 1 capsule (0.4 mg total) by mouth daily.   14 capsule   0     Allergies Tramadol  Family History  Problem Relation Age of Onset  . CAD    . Hypertension    . Kidney failure Neg Hx   . Hematuria Neg Hx   . Sickle cell trait Neg Hx     Social History Social History  Substance Use Topics  . Smoking status: Current Every Day Smoker -- 0.50 packs/day    Types: Cigarettes  . Smokeless tobacco: Never Used  . Alcohol Use: No    Review of Systems Constitutional: + fever, no chills Eyes: No visual changes. ENT: + sore throat. Cardiovascular: +chest pain withcough. Respiratory: Denies shortness of breath. Gastrointestinal: +abdominal pain.  No nausea,  no vomiting.  No diarrhea.  No constipation. Genitourinary: Positive for dysuria. Musculoskeletal: Negative for back pain. Skin: Negative for rash. Neurological: Negative  for headaches, focal weakness or numbness.  10-point ROS otherwise negative.  ____________________________________________   PHYSICAL EXAM:  Filed Vitals:   03/31/16 1830 03/31/16 1845 03/31/16 2000 03/31/16 2011  BP: 144/98  155/86   Pulse:  97 79 79  Temp:      TempSrc:      Resp: Height:      Weight:      SpO2:  97% 97% 97%    VITAL SIGNS: ED Triage Vitals  Enc Vitals Group     BP 03/31/16 1800 189/108 mmHg     Pulse Rate 03/31/16 1800 121     Resp 03/31/16 1800 22     Temp 03/31/16 1800 98.7 F (37.1 C)     Temp Source 03/31/16 1800 Oral     SpO2 03/31/16 1800 98 %     Weight 03/31/16 1800 135 lb (61.236 kg)     Height 03/31/16 1800  (1.676 m)     Head Cir --      Peak Flow --      Pain Score 03/31/16 1801 10     Pain Loc --      Pain Edu? --      Excl. in GC? --     Constitutional: Alert and oriented. Tearful secondary to pain. Eyes: Conjunctivae are normal. PERRL. EOMI. Head: Atraumatic. Nose: + congestion/rhinnorhea. Mouth/Throat: Mucous membranes are moist.  Oropharynx Mildly erythematous without exudate, no uvular deviation or peritonsillar abscess. Neck: No stridor. Supple without meningismus. Cardiovascular: Tachycardic rate, regular rhythm. Grossly normal heart sounds.  Good peripheral circulation. Respiratory: Normal respiratory effort.  No retractions. Lungs CTAB. Gastrointestinal: Soft with suprapubic tenderness to palpation, no rebound or guarding. No CVA tenderness. Genitourinary: Deferred. Musculoskeletal: No lower extremity tenderness nor edema.  No joint effusions. Neurologic:  Normal speech and language. No gross focal neurologic deficits are appreciated. No gait instability. Skin:  Skin is warm, dry and intact. No rash noted. Psychiatric: Mood and affect  are normal. Speech and behavior are normal.  ____________________________________________   LABS (all labs ordered are listed, but only abnormal results are displayed)  Labs Reviewed  CBC WITH DIFFERENTIAL/PLATELET - Abnormal; Notable for the following:    WBC 14.9 (*)    Hemoglobin 11.5 (*)    HCT 34.7 (*)    RDW 16.3 (*)    Neutro Abs 11.6 (*)    Basophils Absolute 0.2 (*)    All other components within normal limits  COMPREHENSIVE METABOLIC PANEL - Abnormal; Notable for the following:    Potassium 3.2 (*)    CO2 21 (*)    Glucose, Bld 126 (*)    Calcium 8.8 (*)    ALT 11 (*)    Total Bilirubin 0.2 (*)    All other components within normal limits  URINALYSIS COMPLETEWITH MICROSCOPIC ([facility] ONLY) - Abnormal; Notable for the following:    Color, Urine YELLOW (*)    APPearance HAZY (*)    Hgb urine dipstick 3+ (*)    All other components within normal limits  CULTURE, BLOOD (ROUTINE X 2)  CULTURE, BLOOD (ROUTINE X 2)  LIPASE, BLOOD  LACTIC ACID, PLASMA  LACTIC ACID, PLASMA  POC URINE PREG, ED  POCT RAPID STREP A  POCT PREGNANCY, URINE   ____________________________________________  EKG  ED ECG REPORT I, Gayla Doss, the attending physician, personally viewed and interpreted this ECG.   Date: 03/31/2016  EKG Time: 19:22  Rate: 90  Rhythm: normal sinus rhythm  Axis: normal  Intervals:none  ST&T Change: No acute ST elevation. RSR prime V1 and V2 is likely normal variant.  ____________________________________________  RADIOLOGY  3 view abdominal plain films IMPRESSION: No acute cardiopulmonary findings.  Unremarkable abdominal radiographs.  ____________________________________________   PROCEDURES  Procedure(s) performed: None  Critical Care performed: No  ____________________________________________   INITIAL IMPRESSION / ASSESSMENT AND PLAN / ED COURSE  Pertinent labs & imaging results that were available during my care of the patient were  reviewed by me and considered in my medical decision making (see chart for details).  Arman BogusXaviera Bartlett is a 40 y.o. female with history of obstructing ureteral stones, renal abscess managed with percutaneous drainage in August 2016, migraines, no other chronic medical problems who presents for evaluation of 5 days of suprapubic abdominal pain radiating to the middle of her back, as well as cough, sneezing, runny nose and sore throat. On exam, she is ready up in bed, tearful. Her vital signs are notable for mild tachycardia, mild tachypnea, she is afebrile, maintaining great blood pressure, O2 sats 98% on room air. She has no tenderness in the suprapubic region CVA tenderness, her lungs are clear however given her complaints, we'll obtain screening labs, 3 view abdominal plain films. As will also help to rule out pneumonia. Give IV fluids, reassess. Disposition. We'll treat her pain. Of note, the patient has had 4 CT scans of the abdomen and pelvis since 05/2015 and given increased cancer risk in the setting of cumulative radiation dose, will try to defer CT scan today if at all possible. I discussed this with the patient and she agrees she would like to avoid additional CT scans unless they are absolutely necessary.  ----------------------------------------- 8:05 PM on 03/31/2016 ----------------------------------------- The patient is sitting up in bed at this time in no distress, she is tachycardic on her Smart phone. Her tachycardia and tachypnea resolved with treatment of her pain. I reviewed her labs, her white blood cell count is elevated at 14.9 however it was also elevated just 1 month ago at 13.3 when she was seen here for chest pain and had no infectious complaints.  CBC also shows mild anemia, CMP with mild hypokalemia. Normal lipase, negative present test, negative strep test. Her venous lactic acid is reassuring at 0.7. Her analysis not consistent with infection and she has chronic hematuria on  chart review. Three-view abdominal plain films done no acute cardiopulmonary abnormality or acute intra-abdominal process. I suspect her symptoms are related to a viral syndrome with possibly bladder spasm. Her pain has improved significantly at this time. Given her reassuring lactic acid level, resolution of vital sign abnormalities with treatment of her pain, I do not think that this represents sepsis. Her blood cultures however were sent. Discussed return precautions, need for close PCP and Urology follow-up and she is comfortable with the discharge plan. DC home. ____________________________________________   FINAL CLINICAL IMPRESSION(S) / ED DIAGNOSES  Final diagnoses:  Lower abdominal pain  Dysuria  URI (upper respiratory infection)      NEW MEDICATIONS STARTED DURING THIS VISIT:  New Prescriptions   No medications on file     Note:  This document was prepared using Dragon voice recognition software and may include unintentional dictation errors.    Gayla DossEryka A Zale Marcotte, MD 03/31/16 2051

## 2016-03-31 NOTE — ED Notes (Signed)
Patient to ER for c/o dysuria, low back pain, and abdominal pain. States she has been running low grade fevers at home.

## 2016-04-05 ENCOUNTER — Telehealth: Payer: Self-pay | Admitting: Emergency Medicine

## 2016-04-05 LAB — URINALYSIS COMPLETE WITH MICROSCOPIC (ARMC ONLY)
BILIRUBIN URINE: NEGATIVE
GLUCOSE, UA: NEGATIVE mg/dL
KETONES UR: NEGATIVE mg/dL
Leukocytes, UA: NEGATIVE
Nitrite: NEGATIVE
Protein, ur: NEGATIVE mg/dL
SPECIFIC GRAVITY, URINE: 1.013 (ref 1.005–1.030)
pH: 7 (ref 5.0–8.0)

## 2016-04-05 LAB — CULTURE, BLOOD (ROUTINE X 2)
CULTURE: NO GROWTH
Culture: NO GROWTH

## 2016-04-05 NOTE — ED Notes (Signed)
Called patient to ask about follow up plans/condition and to inform patient that her ua was corrected.  i left her a message asking her to call me or her pcp.

## 2016-08-05 IMAGING — CT CT ABD-PELV W/ CM
2 of 5 series · 16 of 46 positions shown, 18 images · IV contrast (omnipaque)
Comparison: CT of the abdomen and pelvis from 05/17/2015

CLINICAL DATA: Acute onset of pain and burning at the site of the
patient's right-sided nephrostomy tube. Dysuria, with red/brown
discharge. Initial encounter.

EXAM:
CT ABDOMEN AND PELVIS WITH CONTRAST
TECHNIQUE: Multidetector CT imaging of the abdomen and pelvis was performed
using the standard protocol following bolus administration of
intravenous contrast.
CONTRAST:  100mL OMNIPAQUE IOHEXOL 300 MG/ML  SOLN

[Series 2: routine abd pel with · axial · 0.65mm/px · z∈[-1071,-651]mm · 13 of 94 slices shown, 15 images]
[im 5/94  soft-tissue]
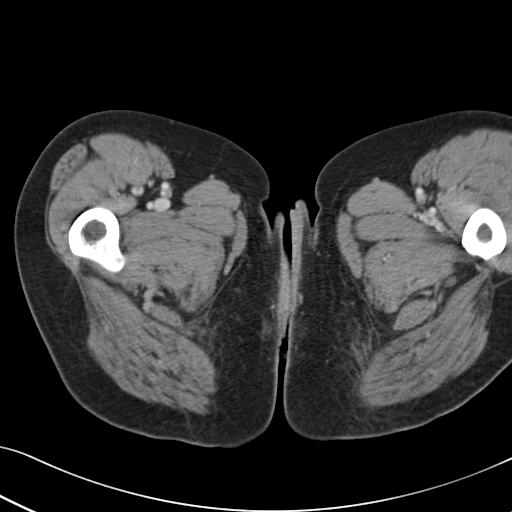
[im 5/94  bone]
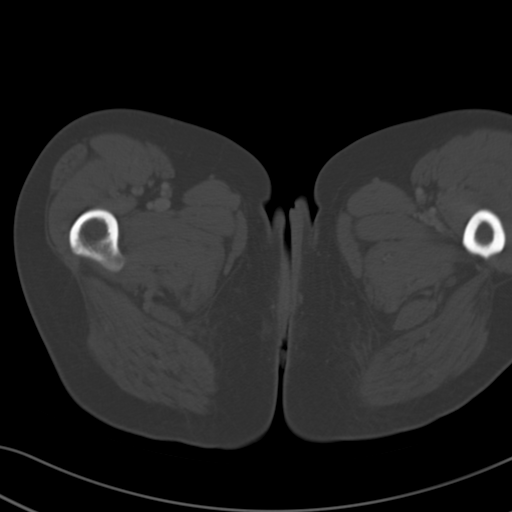
[im 15/94  soft-tissue]
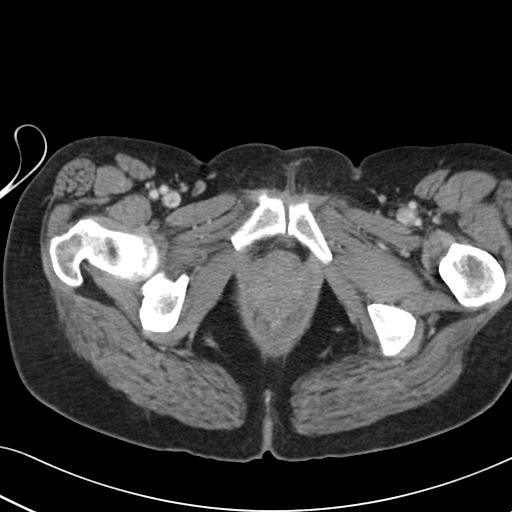
[im 20/94  soft-tissue]
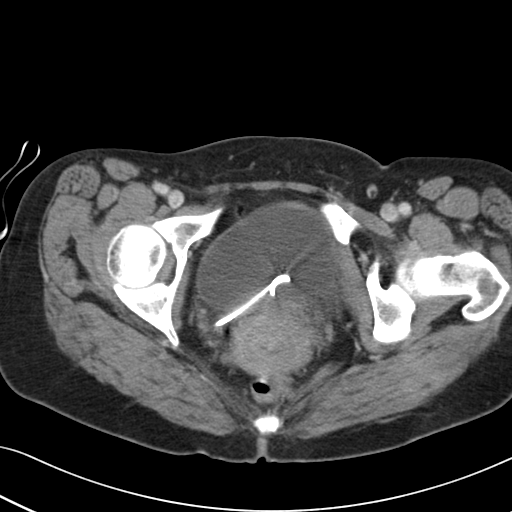
[im 25/94  soft-tissue]
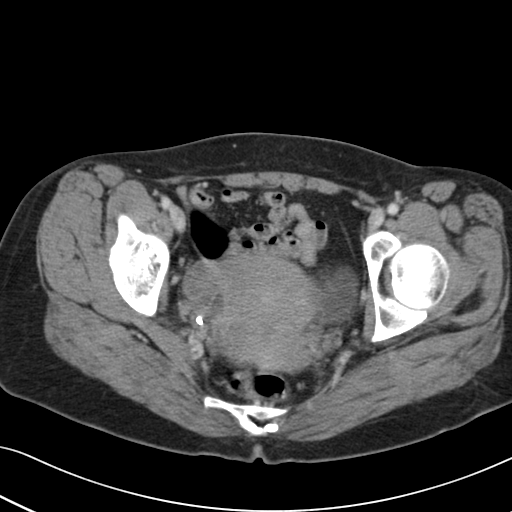
[im 35/94  soft-tissue]
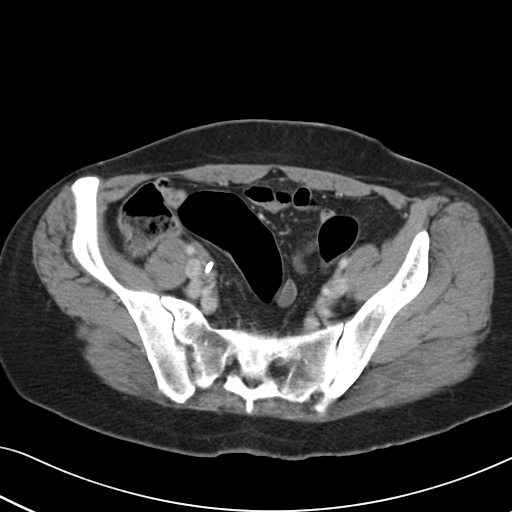
[im 40/94  soft-tissue]
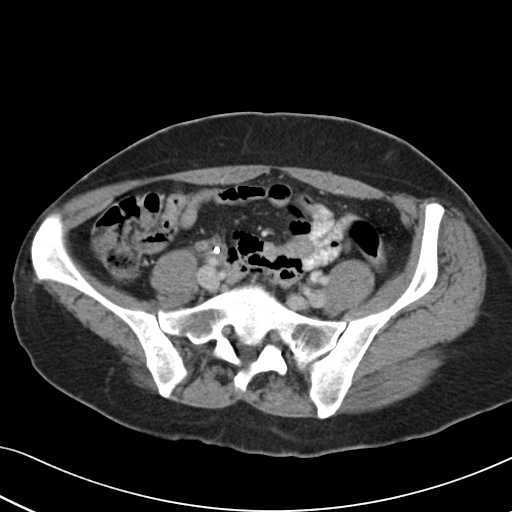
[im 49/94  soft-tissue]
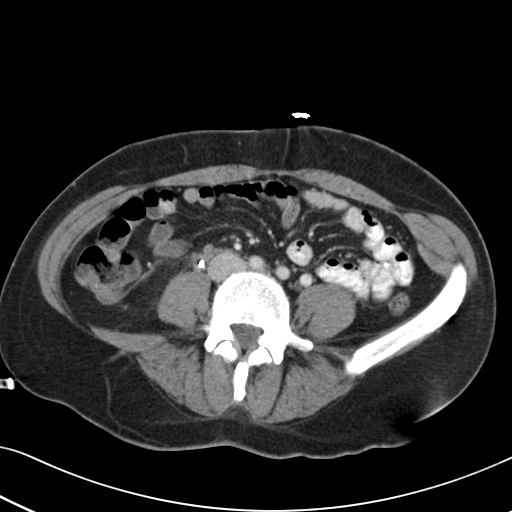
[im 54/94  soft-tissue]
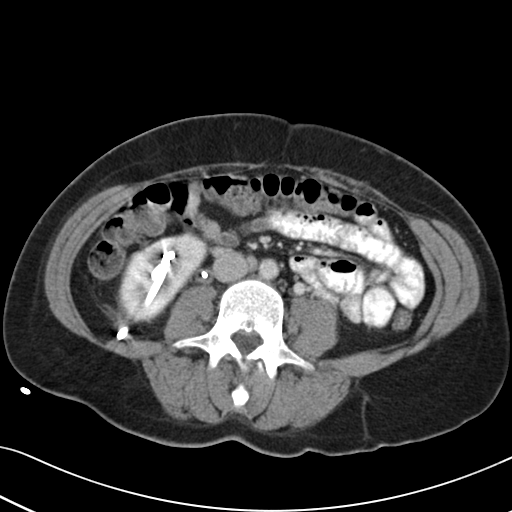
[im 59/94  soft-tissue]
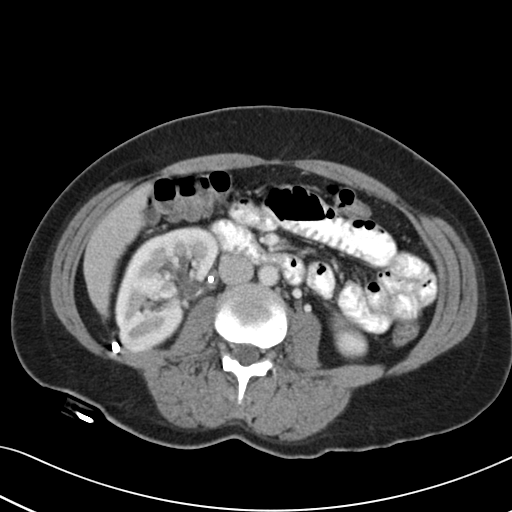
[im 59/94  bone]
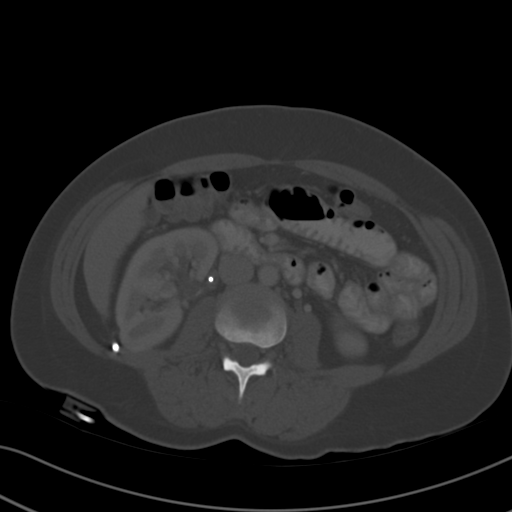
[im 69/94  soft-tissue]
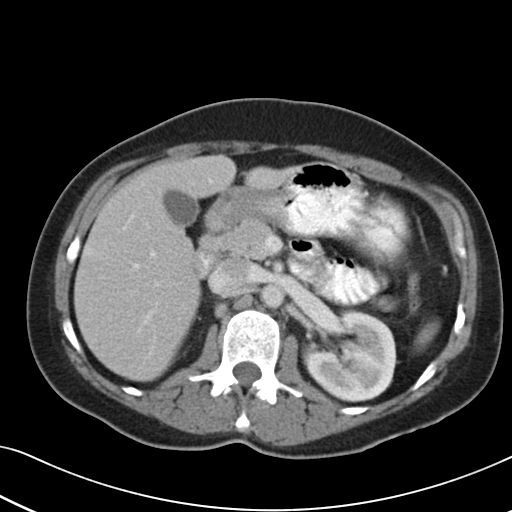
[im 74/94  soft-tissue]
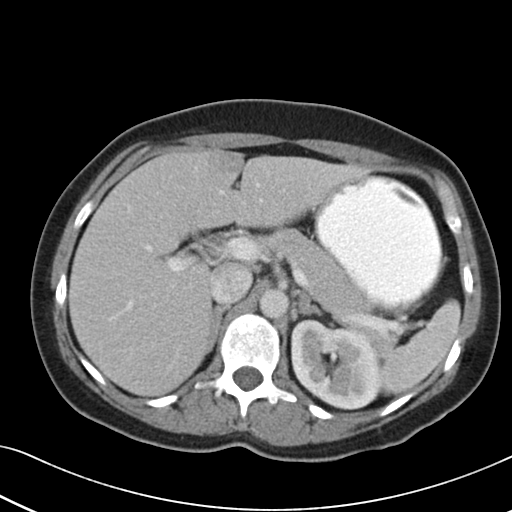
[im 79/94  soft-tissue]
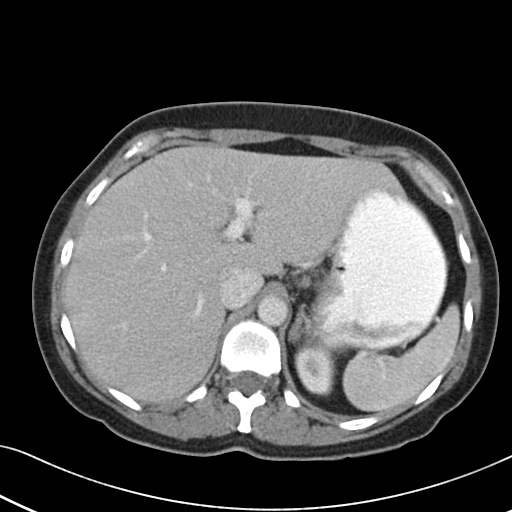
[im 89/94  soft-tissue]
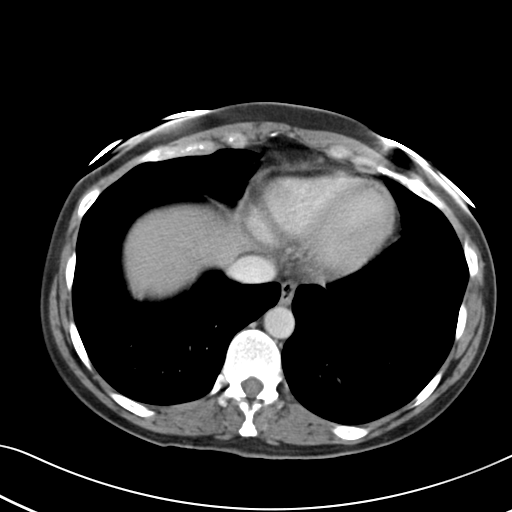

[Series 6: cor routine abd pel with · coronal · 0.95mm/px · 3 of 115 slices shown]
[im 39/115  soft-tissue]
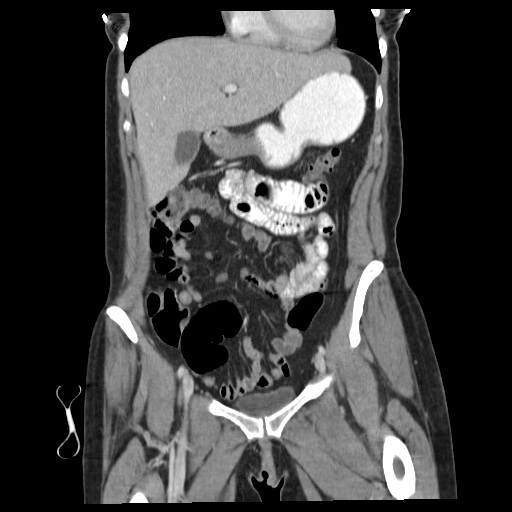
[im 51/115  soft-tissue]
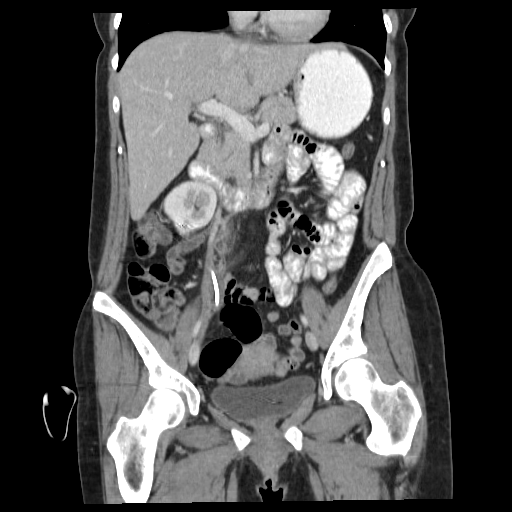
[im 64/115  soft-tissue]
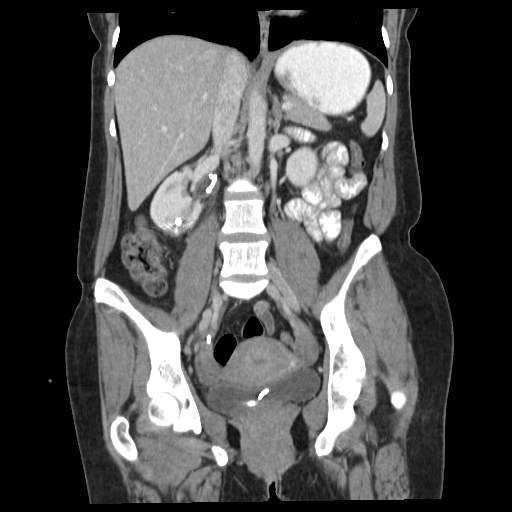

[16 of 46 positions shown; findings below may reference images not displayed]

FINDINGS: The visualized lung bases are clear.

The liver and spleen are unremarkable in appearance. The gallbladder
is within normal limits. The pancreas and adrenal glands are
unremarkable.

Mild soft tissue inflammation is noted about the inferior pole of
the right kidney, near the site of insertion of the nephrostomy
tube, with mild stranding tracking along the course of the proximal
right ureter. This is improved from the prior study, status post
drainage of the right inferior pole renal abscess. Given the prior
presence of an abscess, the patient's symptoms are thought to
reflect underlying pyelonephritis and ureteritis.

No residual abscess is seen at this time. The right ureteral stent
is noted in expected position, without evidence of hydronephrosis.
The left kidney is unremarkable in appearance. No nonobstructing
renal stones are seen. The previously noted distal right ureteral
stone is no longer seen.

No free fluid is identified. The small bowel is unremarkable in
appearance. The stomach is within normal limits. No acute vascular
abnormalities are seen.

The patient is status post appendectomy. The colon is unremarkable
in appearance.

The bladder is mildly distended and grossly unremarkable. The uterus
is grossly unremarkable in appearance. The ovaries are relatively
symmetric. No suspicious adnexal masses are seen. No inguinal
lymphadenopathy is seen.

No acute osseous abnormalities are identified.
IMPRESSION: 1. No evidence of recurrent or residual abscess at the inferior pole
of the right kidney. Given the prior presence of an abscess, the
patient's symptoms are thought to reflect underlying residual
pyelonephritis and ureteritis.
2. Mild soft tissue inflammation about the inferior pole of the
right kidney, near the site of insertion of the nephrostomy tube,
with mild stranding tracking along the course of the proximal right
ureter. This is improved from the prior study.
3. Right ureteral stent noted in expected position, without evidence
of hydronephrosis. Previously noted distal right ureteral stone is
no longer seen.

## 2016-08-10 IMAGING — US US RENAL
1 series · 14 of 25 positions shown · non-contrast
Comparison: CT abdomen pelvis dated 09/08/2015

CLINICAL DATA: Right flank pain x8 hours

EXAM:
RENAL / URINARY TRACT ULTRASOUND COMPLETE

[Series 1: us renal · 0.23mm/px · 14 of 35 slices shown]
[im 1/35]
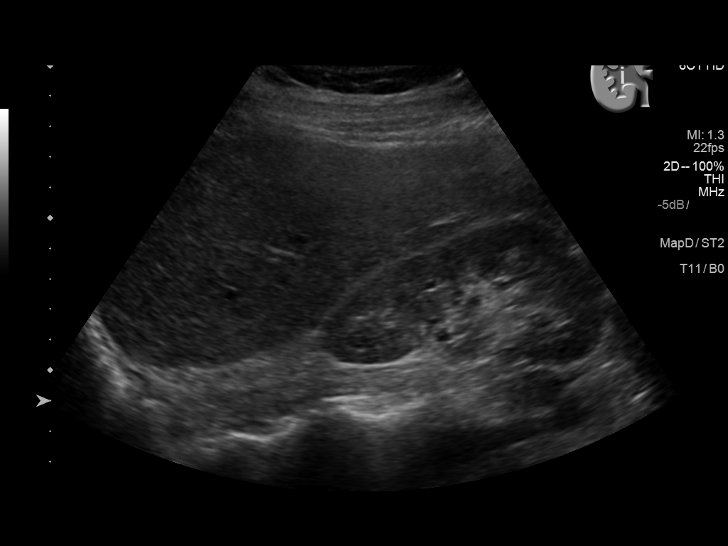
[im 3/35]
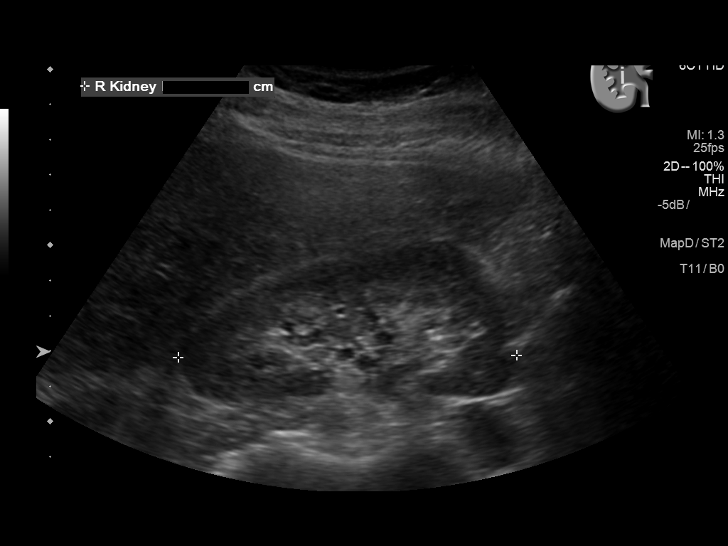
[im 6/35]
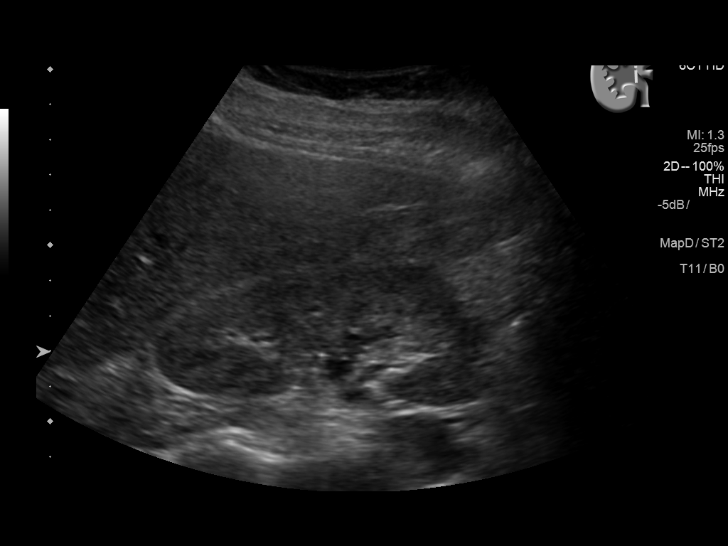
[im 9/35]
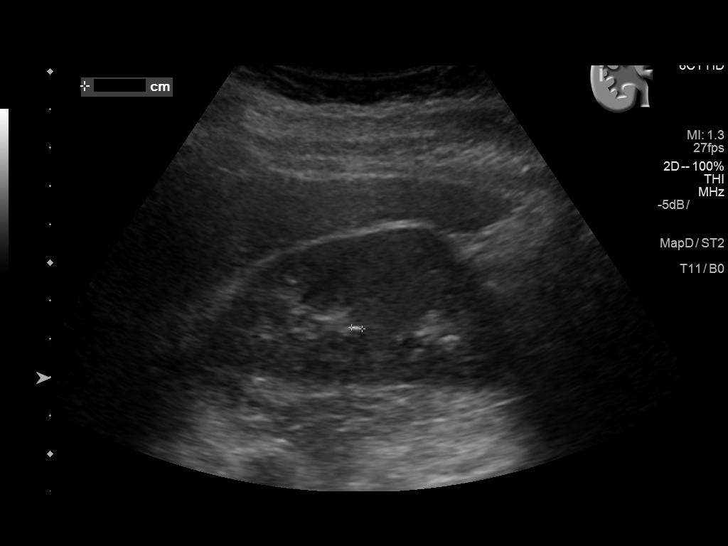
[im 12/35]
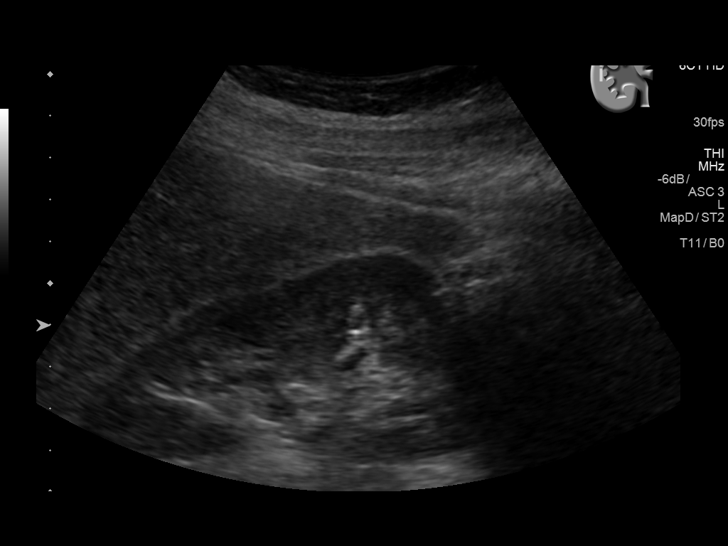
[im 13/35]
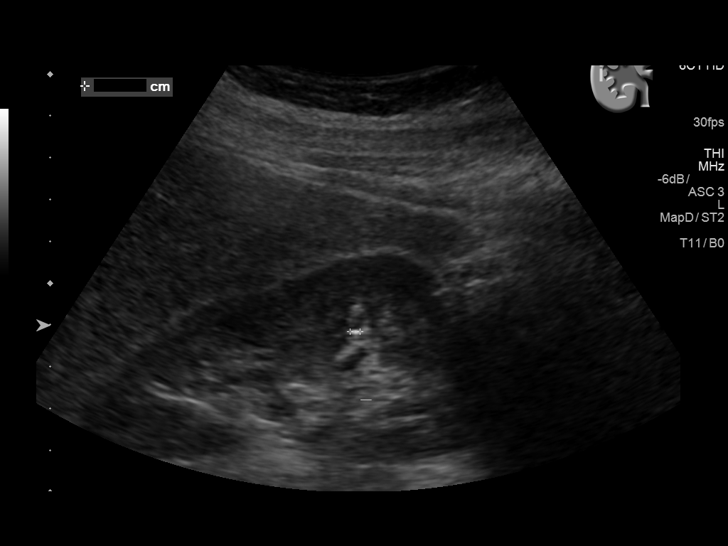
[im 16/35]
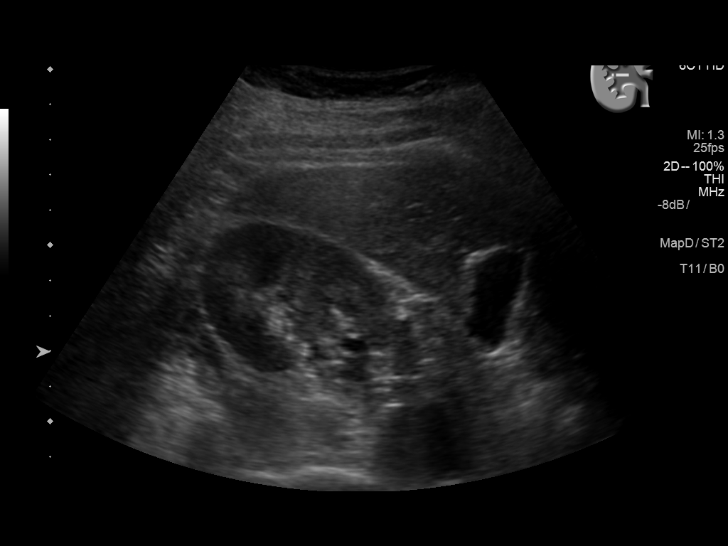
[im 19/35]
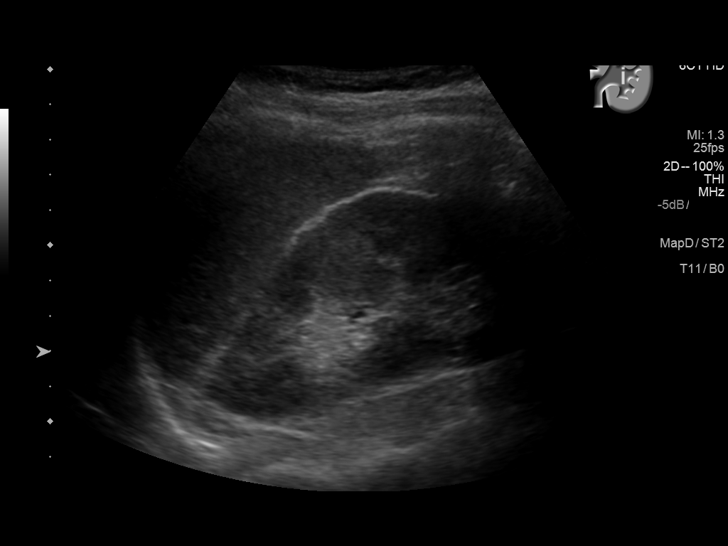
[im 22/35]
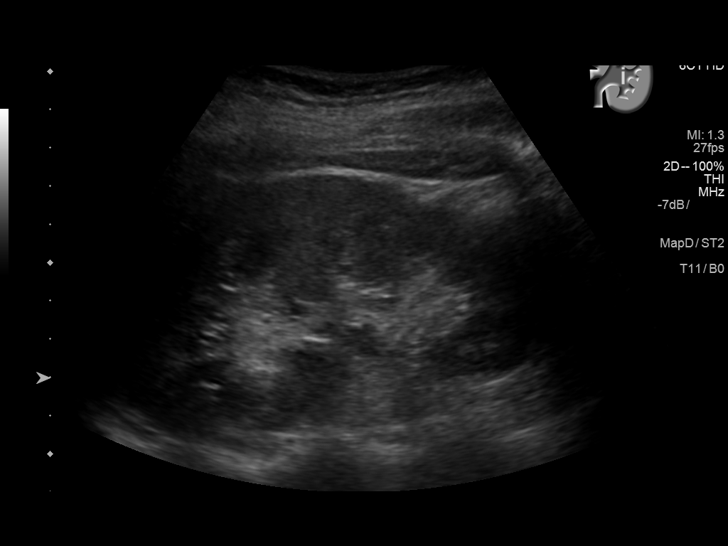
[im 23/35]
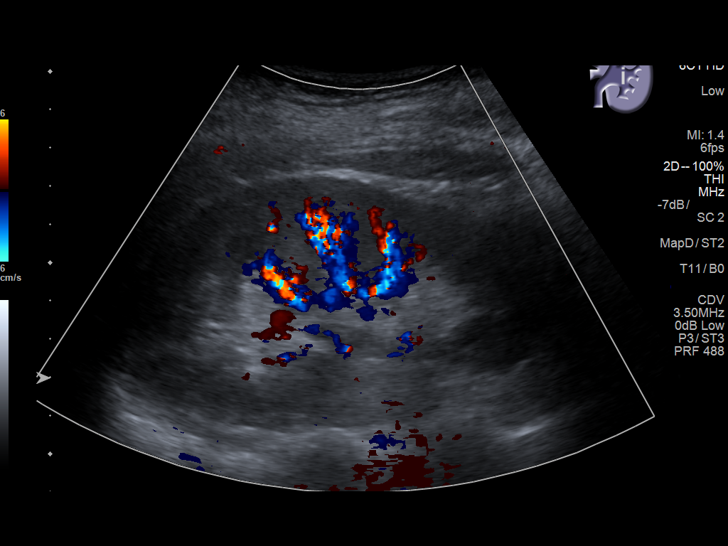
[im 26/35]
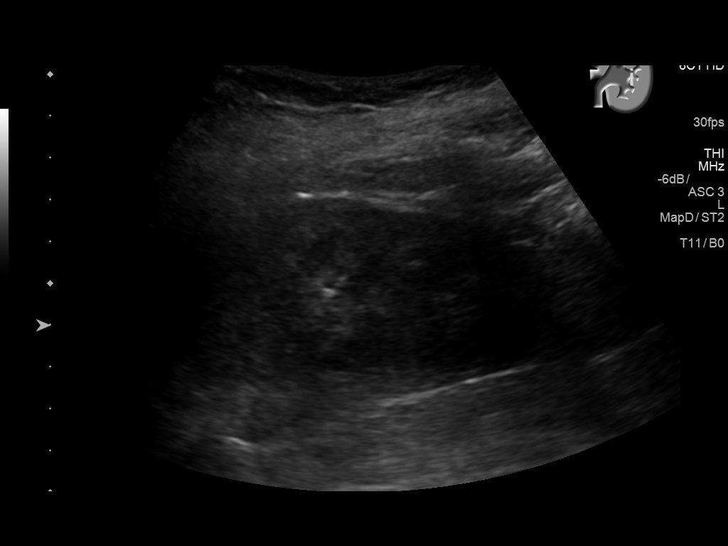
[im 29/35]
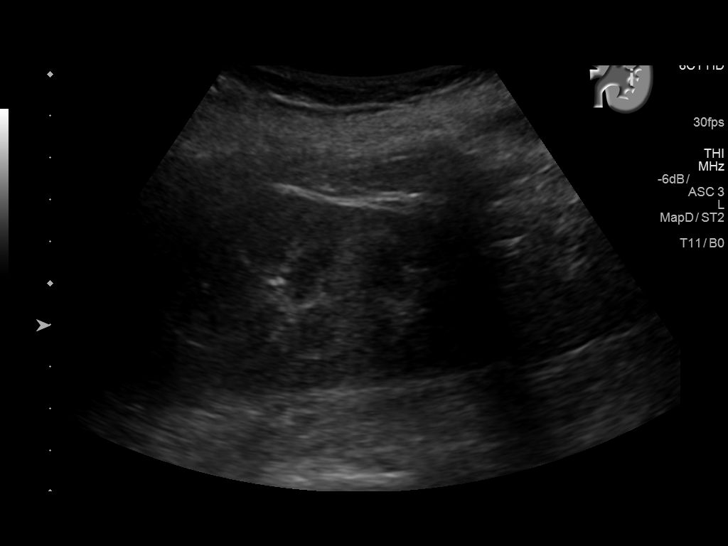
[im 32/35]
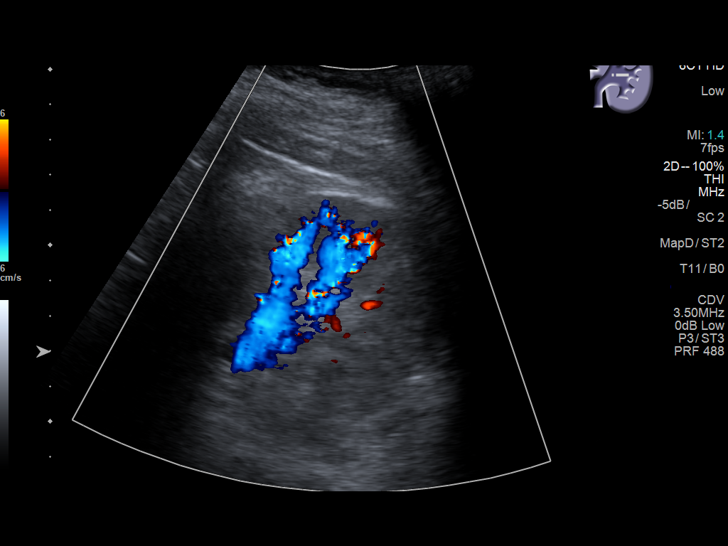
[im 35/35]
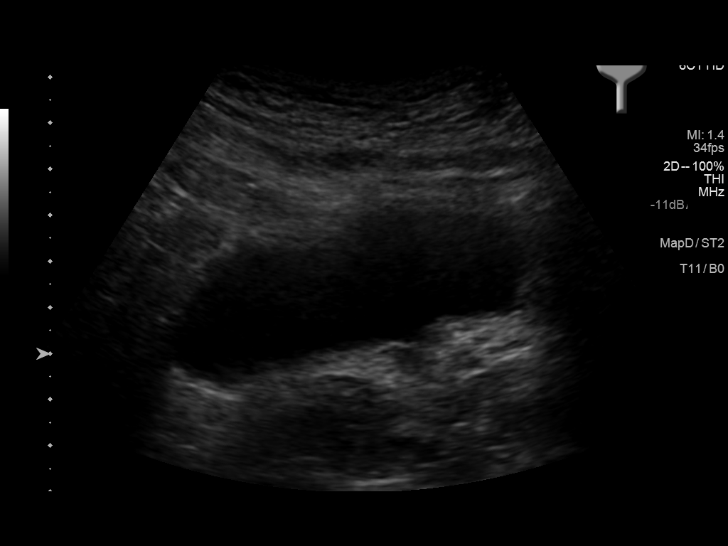

[14 of 25 positions shown; findings below may reference images not displayed]

FINDINGS: Right Kidney:

Length: 9.6 cm. Two suspected interpolar calculi measuring 2-3 mm.
No hydronephrosis.

Left Kidney:

Length: 12.1 cm. Suspected 4 mm interpolar calculus. No
hydronephrosis.

Bladder:

Within normal limits.
IMPRESSION: Bilateral nonobstructing renal calculi measuring up to 4 mm. No
hydronephrosis.

## 2016-08-13 IMAGING — US US TRANSVAGINAL NON-OB
1 series · 13 of 25 positions shown · non-contrast
Comparison: CT Abdomen and Pelvis from 2900 hours today and
earlier.

CLINICAL DATA: 39-year-old female with increasing right flank pain,
gross hematuria, nausea vomiting. Initial encounter.



[Series 1: us transvaginal non-ob · 0.24mm/px · 13 of 85 slices shown]
[im 1/85]
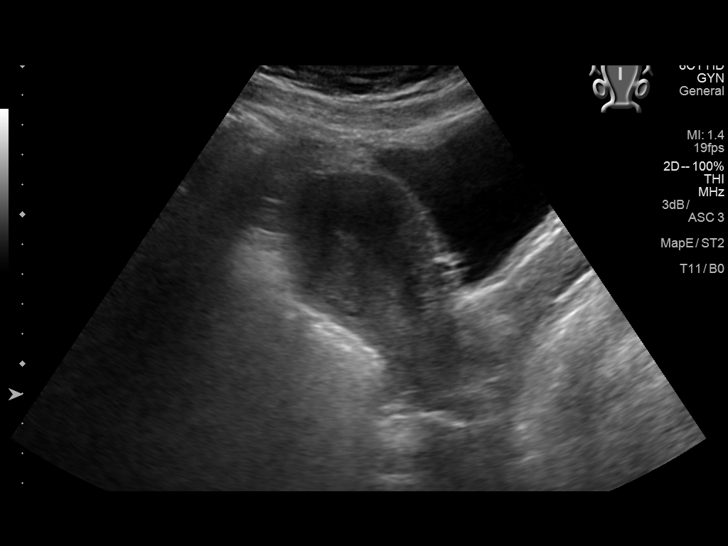
[im 8/85]
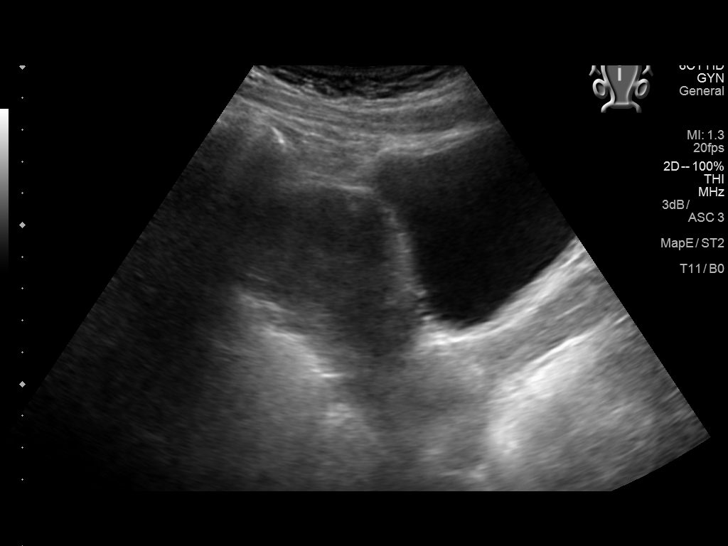
[im 15/85]
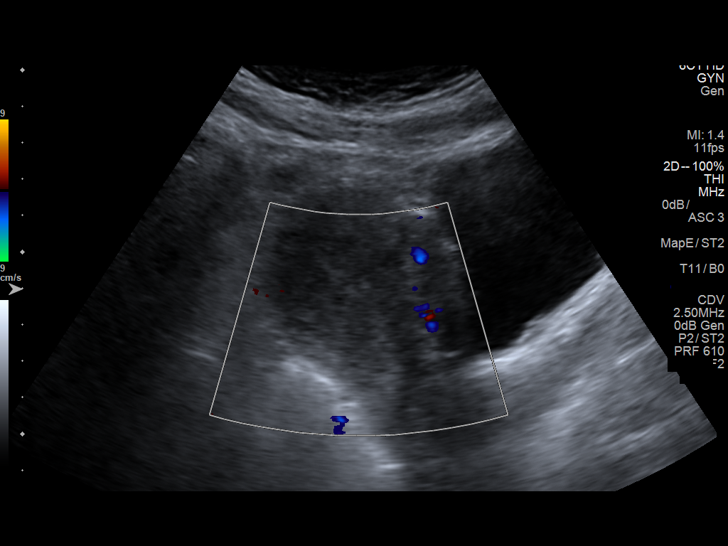
[im 22/85]
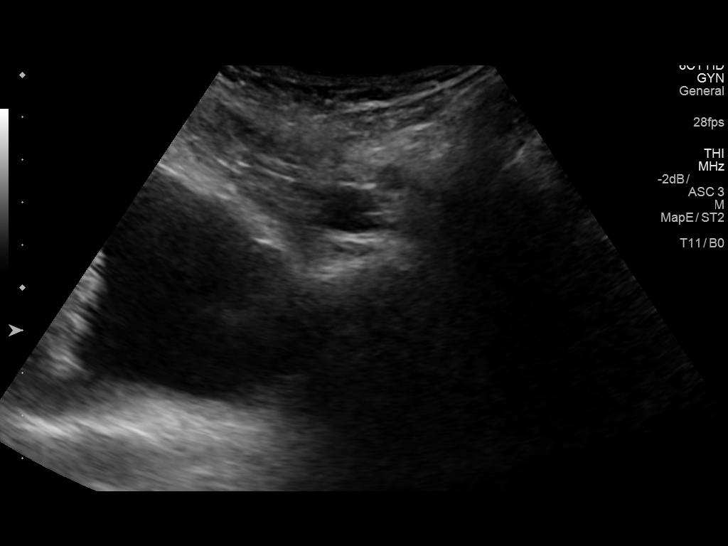
[im 29/85]
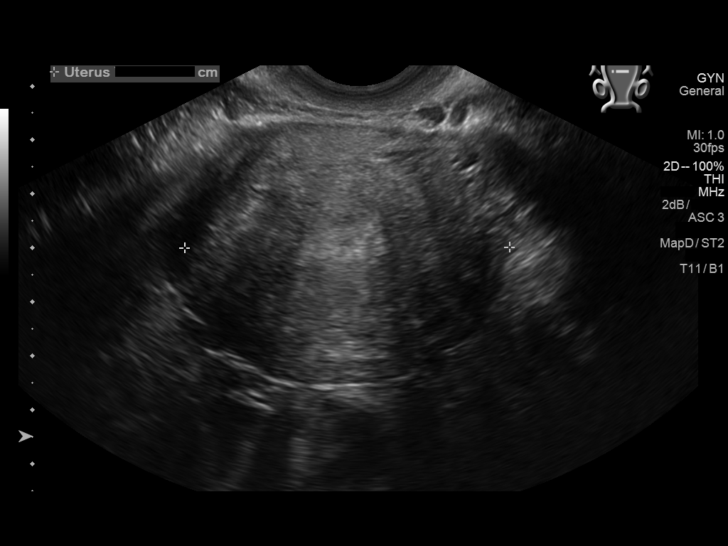
[im 36/85]
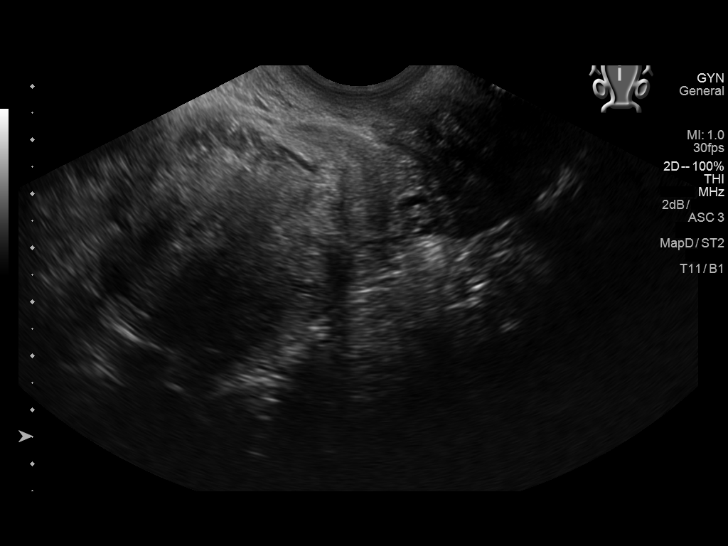
[im 43/85]
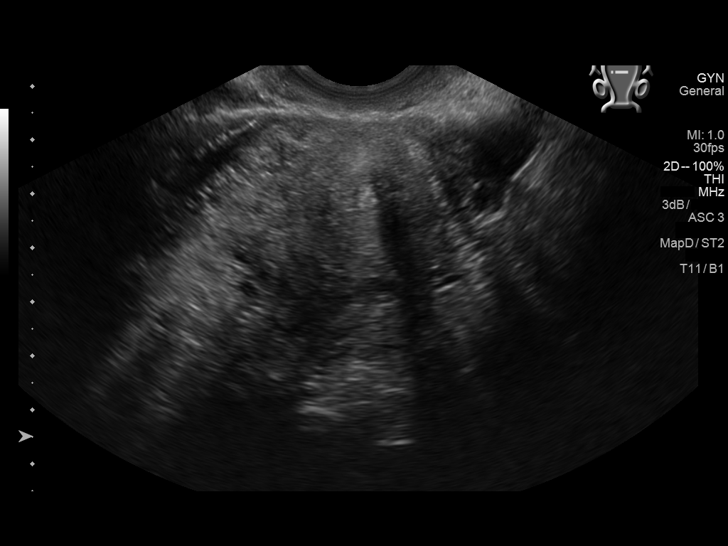
[im 50/85]
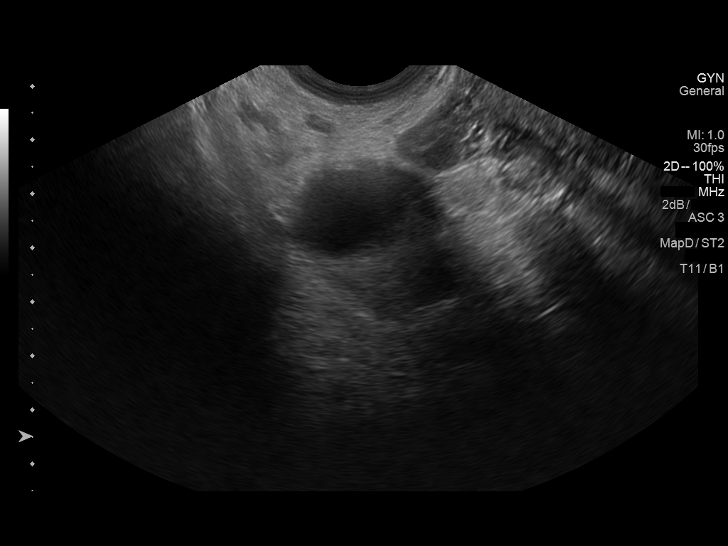
[im 57/85]
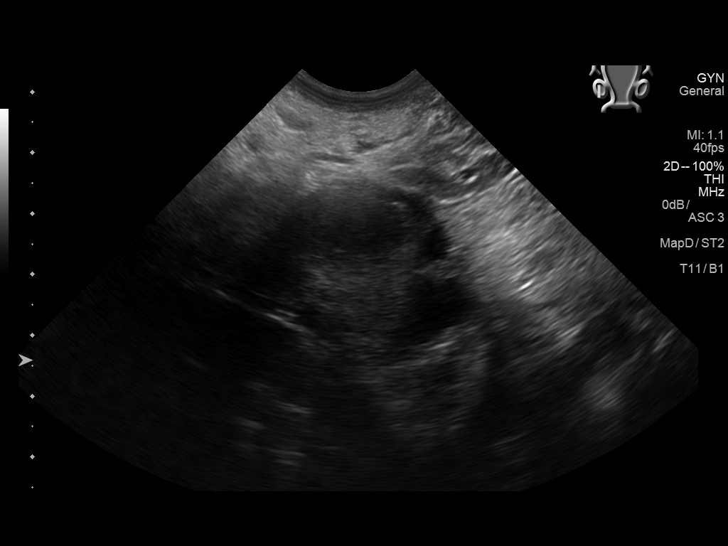
[im 64/85]
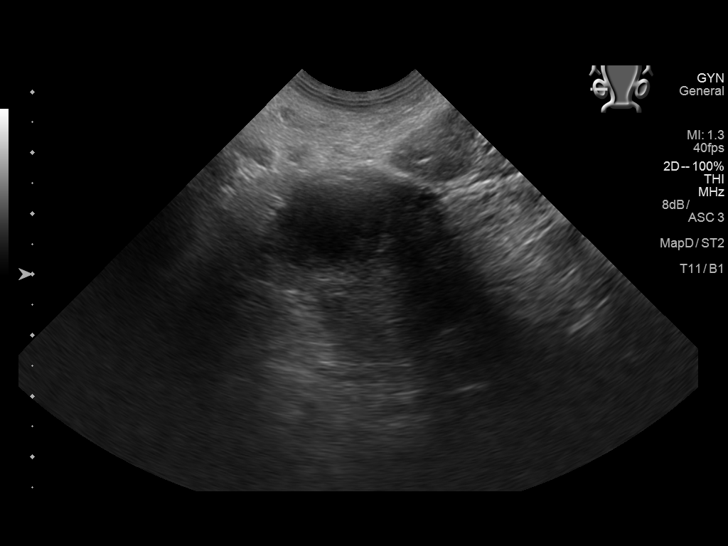
[im 71/85]
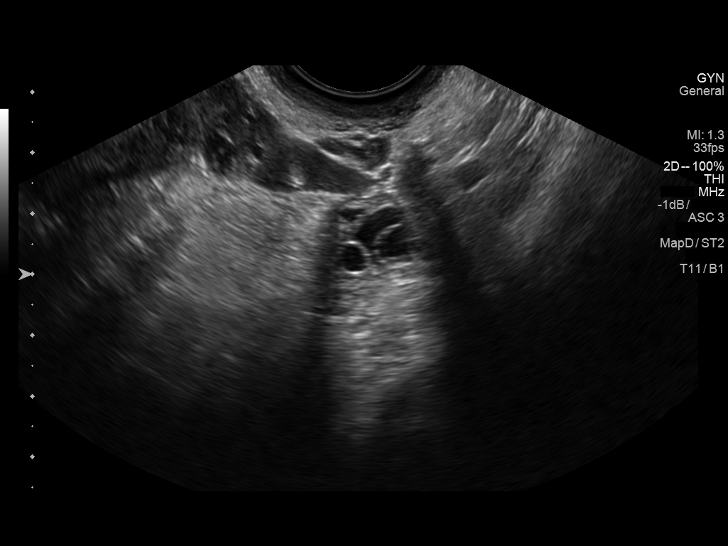
[im 78/85]
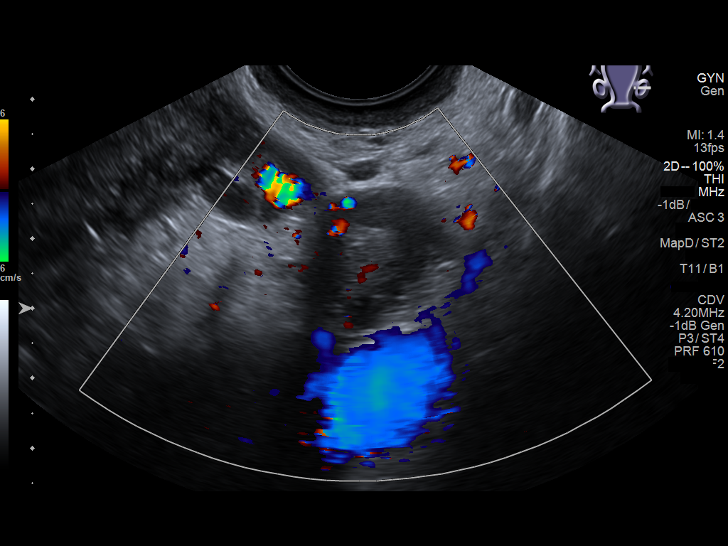
[im 85/85]
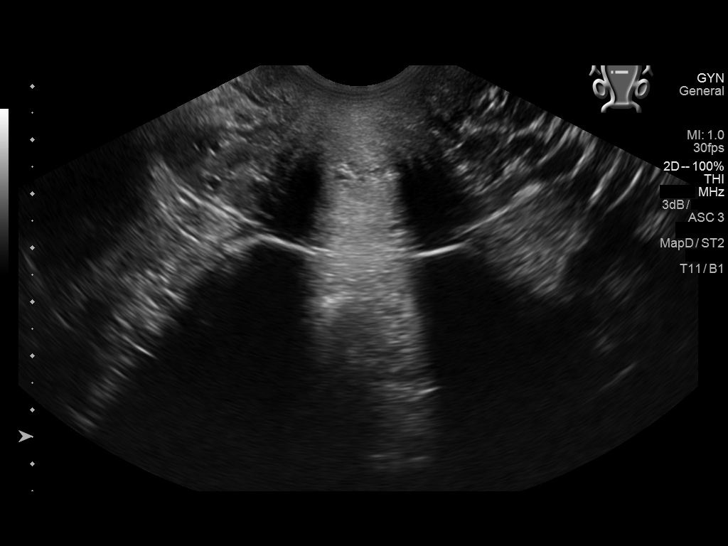

[13 of 25 positions shown; findings below may reference images not displayed]

FINDINGS: Uterus

Measurements: 10.2 x 5.1 x 6.1 cm.. No fibroids or other mass
visualized.

Endometrium

Thickness: 7 mm.  No focal abnormality visualized.

Right ovary

Measurements: 4.2 x 2.4 x 3.2 cm. Simple 2 cm cyst or follicle
(image 71), a normal finding and reproductive age patient. Normal
appearance/no adnexal mass.

Left ovary

Measurements: 3.4 x 2.0 x 1.9 cm. Small follicles. Normal
appearance/no adnexal mass.

Other findings

No abnormal free fluid.
IMPRESSION: Normal reproductive age ultrasound appearance of the pelvis;
physiologic 2 cm right adnexal cyst.

## 2017-09-27 ENCOUNTER — Other Ambulatory Visit: Payer: Self-pay

## 2017-09-27 ENCOUNTER — Emergency Department
Admission: EM | Admit: 2017-09-27 | Discharge: 2017-09-27 | Disposition: A | Discharge status: Home / Self Care | Payer: Self-pay | Attending: Emergency Medicine | Admitting: Emergency Medicine

## 2017-09-27 ENCOUNTER — Emergency Department: Payer: Self-pay

## 2017-09-27 ENCOUNTER — Encounter: Payer: Self-pay | Admitting: Emergency Medicine

## 2017-09-27 DIAGNOSIS — W228XXA Striking against or struck by other objects, initial encounter: Secondary | ICD-10-CM | POA: Insufficient documentation

## 2017-09-27 DIAGNOSIS — Y999 Unspecified external cause status: Secondary | ICD-10-CM | POA: Insufficient documentation

## 2017-09-27 DIAGNOSIS — S6981XA Other specified injuries of right wrist, hand and finger(s), initial encounter: Secondary | ICD-10-CM | POA: Insufficient documentation

## 2017-09-27 DIAGNOSIS — Z79899 Other long term (current) drug therapy: Secondary | ICD-10-CM | POA: Insufficient documentation

## 2017-09-27 DIAGNOSIS — F17210 Nicotine dependence, cigarettes, uncomplicated: Secondary | ICD-10-CM | POA: Insufficient documentation

## 2017-09-27 DIAGNOSIS — S6991XA Unspecified injury of right wrist, hand and finger(s), initial encounter: Secondary | ICD-10-CM

## 2017-09-27 DIAGNOSIS — Y929 Unspecified place or not applicable: Secondary | ICD-10-CM | POA: Insufficient documentation

## 2017-09-27 DIAGNOSIS — Y93G1 Activity, food preparation and clean up: Secondary | ICD-10-CM | POA: Insufficient documentation

## 2017-09-27 MED ORDER — HYDROCODONE-ACETAMINOPHEN 5-325 MG PO TABS: 1.0000 | ORAL_TABLET | ORAL | 0 refills | Status: AC | PRN

## 2017-09-27 MED ORDER — IBUPROFEN 600 MG PO TABS: 600.0000 mg | ORAL_TABLET | Freq: Four times a day (QID) | ORAL | 0 refills | Status: DC | PRN

## 2017-09-27 MED ORDER — HYDROCODONE-ACETAMINOPHEN 5-325 MG PO TABS
1.0000 | ORAL_TABLET | Freq: Once | ORAL | Status: AC
  Administered 2017-09-27: 1 via ORAL
  Filled 2017-09-27: qty 1

## 2017-09-27 NOTE — Discharge Instructions (Signed)
Follow up with the primary care provider for symptoms that are not improving over the week. ° °Return to the ER for symptoms that change or worsen if unable to schedule an appointment. °

## 2017-09-27 NOTE — ED Notes (Signed)
Pt states does not want to file WC.

## 2017-09-27 NOTE — ED Triage Notes (Signed)
Caught hadn in mixer at 5 am, small wounds noted, painful

## 2017-09-27 NOTE — ED Provider Notes (Signed)
[facility] Emergency Department Provider Note ____________________________________________  Time seen: Approximately 3:43 PM  I have reviewed the triage vital signs and the nursing notes.   HISTORY  Chief Complaint Hand Injury   HPI [name] is a 41 y.o. female who presents to the emergency department for evaluation and treatment of right hand and wrist pain.  She states that while at work this morning, she was trying to hurry and get things done.  She states that she was using a spatula inside of a stand mixer and although the mixer was off, the attachment was still spinning and caught the spatula that she was holding which caused her arm and hand to twist.  She states that she continued to work through the day, but as the day past her hand and wrist has become swollen and stiff and very painful.  She has tak some Tylenol without relief.  Past Medical History:  Diagnosis Date  . Kidney stones   . Migraine   . Renal abscess, right     Patient Active Problem List   Diagnosis Date Noted  . Sepsis (HCC) 05/27/2015  . Pyelonephritis 05/27/2015  . Renal abscess 05/27/2015  . Ureteral stone with hydronephrosis 05/17/2015    Past Surgical History:  Procedure Laterality Date  . APPENDECTOMY    . CESAREAN SECTION    . CYSTOSCOPY WITH RETROGRADE PYELOGRAM, URETEROSCOPY AND STENT PLACEMENT Right 05/17/2015   Procedure: CYSTOSCOPY WITH RETROGRADE PYELOGRAM, URETEROSCOPY AND STENT PLACEMENT;  Surgeon: Jerilee FieldMatthew Eskridge, MD;  Location: [facility] ORS;  Service: Urology;  Laterality: Right;  . RENAL ARTERY STENT    . TUBAL LIGATION    . URETEROSCOPY WITH HOLMIUM LASER LITHOTRIPSY  05/17/2015   Procedure: URETEROSCOPY WITH HOLMIUM LASER LITHOTRIPSY;  Surgeon: Jerilee FieldMatthew Eskridge, MD;  Location: [facility] ORS;  Service: Urology;;    Prior to Admission medications   Medication Sig Start Date End Date Taking? Authorizing Provider  ciprofloxacin (CIPRO) 500 MG tablet Take 1  tablet (500 mg total) by mouth 2 (two) times daily. 05/29/15   Adrian SaranMody, Sital, MD  diazepam (VALIUM) 5 MG tablet Take 1 tablet (5 mg total) by mouth every 8 (eight) hours as needed for muscle spasms. 06/05/15   Emily FilbertWilliams, Jonathan E, MD  diazepam (VALIUM) 5 MG tablet Take 1 tablet (5 mg total) by mouth every 8 (eight) hours as needed for muscle spasms. 06/15/15   Emily FilbertWilliams, Jonathan E, MD  docusate sodium (COLACE) 100 MG capsule Take 1 capsule (100 mg total) by mouth 2 (two) times daily. 05/29/15   Adrian SaranMody, Sital, MD  HYDROcodone-acetaminophen (NORCO/VICODIN) 5-325 MG tablet Take 1 tablet by mouth every 4 (four) hours as needed for moderate pain. 09/27/17 09/27/18  Alva Kuenzel, Kasandra Knudsenari B, FNP  ibuprofen (ADVIL,MOTRIN) 600 MG tablet Take 1 tablet (600 mg total) by mouth every 6 (six) hours as needed. 09/27/17   Tatyanna Cronk B, FNP  LORazepam (ATIVAN) 1 MG tablet Take 1 tablet (1 mg total) by mouth 2 (two) times daily as needed for anxiety. 02/15/16   Jennye MoccasinQuigley, Brian S, MD  metoCLOPramide (REGLAN) 10 MG tablet Take 1 tablet (10 mg total) by mouth every 8 (eight) hours as needed (migraine). 02/15/16 02/14/17  Jennye MoccasinQuigley, Brian S, MD  nicotine (NICODERM CQ - DOSED IN MG/24 HOURS) 21 mg/24hr patch Place 1 patch (21 mg total) onto the skin daily. Patient not taking: Reported on 06/02/2015 05/29/15   Adrian SaranMody, Sital, MD  ondansetron (ZOFRAN ODT) 4 MG disintegrating tablet Take 1 tablet (4 mg total) by  mouth every 8 (eight) hours as needed for nausea or vomiting. 12/31/15   Gayla DossGayle, Eryka A, MD  oxyCODONE (ROXICODONE) 5 MG immediate release tablet Take 1 tablet (5 mg total) by mouth every 6 (six) hours as needed for moderate pain. Do not drive while taking this medication. 12/31/15   Gayla DossGayle, Eryka A, MD  oxyCODONE-acetaminophen (PERCOCET) 5-325 MG tablet Take 2 tablets by mouth every 6 (six) hours as needed for moderate pain or severe pain. 01/03/16   Emily FilbertWilliams, Jonathan E, MD  oxyCODONE-acetaminophen (PERCOCET/ROXICET) 5-325 MG tablet Take 1  tablet by mouth every 6 (six) hours as needed for severe pain. 09/08/15   Rockne MenghiniNorman, Anne-Caroline, MD  sulfamethoxazole-trimethoprim (BACTRIM DS) 800-160 MG per tablet Take 1 tablet by mouth 2 (two) times daily. 06/15/15   Emily FilbertWilliams, Jonathan E, MD  tamsulosin (FLOMAX) 0.4 MG CAPS capsule Take 1 capsule (0.4 mg total) by mouth daily. 12/31/15   Gayla DossGayle, Eryka A, MD    Allergies Tramadol  Family History  Problem Relation Age of Onset  . CAD Unknown   . Hypertension Unknown   . Kidney failure Neg Hx   . Hematuria Neg Hx   . Sickle cell trait Neg Hx     Social History Social History   Tobacco Use  . Smoking status: Current Every Day Smoker    Packs/day: 0.50    Types: Cigarettes  . Smokeless tobacco: Never Used  Substance Use Topics  . Alcohol use: No  . Drug use: No    Review of Systems Constitutional: Negative for recent illness. Cardiovascular: Negative for chest pain Respiratory: Negative for shortness of breath Musculoskeletal: Positive for right wrist and hand pain Skin: Positive for bruising and abrasions to the right hand and wrist Neurological: Negative for paresthesias.  ____________________________________________   PHYSICAL EXAM:  VITAL SIGNS: ED Triage Vitals  Enc Vitals Group     BP 09/27/17 1445 (!) 144/92     Pulse Rate 09/27/17 1445 81     Resp 09/27/17 1445 18     Temp 09/27/17 1445 98.2 F (36.8 C)     Temp Source 09/27/17 1445 Oral     SpO2 09/27/17 1445 96 %     Weight 09/27/17 1447 145 lb (65.8 kg)     Height 09/27/17 1447 5\' 7"  (1.702 m)     Head Circumference --      Peak Flow --      Pain Score 09/27/17 1445 10     Pain Loc --      Pain Edu? --      Excl. in GC? --     Constitutional: Alert and oriented. Well appearing and in no acute distress. Eyes: Conjunctivae are clear without discharge or drainage Head: Atraumatic Neck: Full, active range of motion is observed. Respiratory: Respirations even and unlabored. Musculoskeletal: Full,  active range of motion over the fingers of the right hand.  Limited range of motion of the right wrist secondary to pain.  No obvious deformity noted. Neurologic: Sharp and dull sensation is intact over the right wrist and hand. Skin: Early ecchymosis is noted over the second MCP.  Scattered linear superficial abrasions are present to the dorsal aspect of the right hand and wrist. Psychiatric: Tearful, affect and behavior are appropriate.  ____________________________________________   LABS (all labs ordered are listed, but only abnormal results are displayed)  Labs Reviewed - No data to display ____________________________________________  RADIOLOGY  Right hand is negative for acute bony abnormality per radiology. I, Kem Boroughsari Cintya Daughety, personally  viewed and evaluated these images (plain radiographs) as part of my medical decision making, as well as reviewing the written report by the radiologist.  ___________________________________________   PROCEDURES  .Splint Application Date/Time: 09/27/2017 4:32 PM Performed by: Chinita Pester, FNP Authorized by: Chinita Pester, FNP   Consent:    Consent obtained:  Verbal   Consent given by:  Patient   Alternatives discussed:  No treatment Pre-procedure details:    Sensation:  Normal Procedure details:    Laterality:  Right   Location:  Wrist   Wrist:  R wrist   Splint type:  Wrist   Supplies:  Prefabricated splint Post-procedure details:    Pain:  Improved   Sensation:  Normal   Patient tolerance of procedure:  Tolerated well, no immediate complications    ____________________________________________   INITIAL IMPRESSION / ASSESSMENT AND PLAN / ED COURSE  Tonetta Napoles is a 41 y.o. female who presents to the emergency department for evaluation and treatment of right hand pain after an injury she sustained this morning when at work.  X-ray images are negative for acute bony abnormality.  She will be treated with  immobilization and advised to follow-up with primary care for symptoms that are not improving over the week.  She was given a prescription for ibuprofen and Norco.  She is to return to the emergency department for symptoms of change or worsen if unable to schedule an appointment.  Medications  HYDROcodone-acetaminophen (NORCO/VICODIN) 5-325 MG per tablet 1 tablet (1 tablet Oral Given 09/27/17 1622)    Pertinent labs & imaging results that were available during my care of the patient were reviewed by me and considered in my medical decision making (see chart for details).  _________________________________________   FINAL CLINICAL IMPRESSION(S) / ED DIAGNOSES  Final diagnoses:  Injury of right hand, initial encounter    ED Discharge Orders        Ordered    HYDROcodone-acetaminophen (NORCO/VICODIN) 5-325 MG tablet  Every 4 hours PRN     09/27/17 1623    ibuprofen (ADVIL,MOTRIN) 600 MG tablet  Every 6 hours PRN     09/27/17 1623       If controlled substance prescribed during this visit, 12 month history viewed on the [facility] prior to issuing an initial prescription for Schedule II or III opiod.    Chinita Pester, FNP 09/27/17 1634    Arnaldo Natal, MD 09/28/17 1540

## 2017-10-26 ENCOUNTER — Encounter: Payer: Self-pay | Admitting: Emergency Medicine

## 2017-10-26 ENCOUNTER — Emergency Department
Admission: EM | Admit: 2017-10-26 | Discharge: 2017-10-26 | Disposition: A | Discharge status: Home / Self Care | Payer: Self-pay | Attending: Emergency Medicine | Admitting: Emergency Medicine

## 2017-10-26 ENCOUNTER — Other Ambulatory Visit: Payer: Self-pay

## 2017-10-26 DIAGNOSIS — R519 Headache, unspecified: Secondary | ICD-10-CM

## 2017-10-26 DIAGNOSIS — R197 Diarrhea, unspecified: Secondary | ICD-10-CM | POA: Insufficient documentation

## 2017-10-26 DIAGNOSIS — Z79899 Other long term (current) drug therapy: Secondary | ICD-10-CM | POA: Insufficient documentation

## 2017-10-26 DIAGNOSIS — F17210 Nicotine dependence, cigarettes, uncomplicated: Secondary | ICD-10-CM | POA: Insufficient documentation

## 2017-10-26 DIAGNOSIS — R51 Headache: Secondary | ICD-10-CM | POA: Insufficient documentation

## 2017-10-26 LAB — COMPREHENSIVE METABOLIC PANEL
ALT: 15 U/L (ref 14–54)
AST: 19 U/L (ref 15–41)
Albumin: 4.5 g/dL (ref 3.5–5.0)
Alkaline Phosphatase: 86 U/L (ref 38–126)
Anion gap: 14 (ref 5–15)
BUN: 12 mg/dL (ref 6–20)
CHLORIDE: 104 mmol/L (ref 101–111)
CO2: 18 mmol/L — AB (ref 22–32)
Calcium: 9 mg/dL (ref 8.9–10.3)
Creatinine, Ser: 0.67 mg/dL (ref 0.44–1.00)
GFR calc non Af Amer: >60 mL/min (ref >60)
Glucose, Bld: 99 mg/dL (ref 65–99)
Potassium: 3.5 mmol/L (ref 3.5–5.1)
SODIUM: 136 mmol/L (ref 135–145)
Total Bilirubin: 0.7 mg/dL (ref 0.3–1.2)
Total Protein: 8.6 g/dL — ABNORMAL HIGH (ref 6.5–8.1)

## 2017-10-26 LAB — URINALYSIS, COMPLETE (UACMP) WITH MICROSCOPIC
Bilirubin Urine: NEGATIVE
Glucose, UA: NEGATIVE mg/dL
Hgb urine dipstick: NEGATIVE
KETONES UR: 20 mg/dL — AB
Nitrite: NEGATIVE
PROTEIN: 30 mg/dL — AB
Specific Gravity, Urine: 1.021 (ref 1.005–1.030)
pH: 5 (ref 5.0–8.0)

## 2017-10-26 LAB — CBC WITH DIFFERENTIAL/PLATELET
Basophils Absolute: 0.2 10*3/uL — ABNORMAL HIGH (ref 0–0.1)
Basophils Relative: 1 %
Eosinophils Absolute: 0 10*3/uL (ref 0–0.7)
Eosinophils Relative: 0 %
HEMATOCRIT: 38.2 % (ref 35.0–47.0)
HEMOGLOBIN: 12.2 g/dL (ref 12.0–16.0)
LYMPHS ABS: 1.3 10*3/uL (ref 1.0–3.6)
LYMPHS PCT: 11 %
MCH: 25.2 pg — AB (ref 26.0–34.0)
MCHC: 32.1 g/dL (ref 32.0–36.0)
MCV: 78.6 fL — AB (ref 80.0–100.0)
MONOS PCT: 4 %
Monocytes Absolute: 0.4 10*3/uL (ref 0.2–0.9)
NEUTROS ABS: 10.6 10*3/uL — AB (ref 1.4–6.5)
NEUTROS PCT: 84 %
Platelets: 374 10*3/uL (ref 150–440)
RBC: 4.86 MIL/uL (ref 3.80–5.20)
RDW: 17 % — ABNORMAL HIGH (ref 11.5–14.5)
WBC: 12.6 10*3/uL — ABNORMAL HIGH (ref 3.6–11.0)

## 2017-10-26 LAB — POCT PREGNANCY, URINE: PREG TEST UR: NEGATIVE

## 2017-10-26 MED ORDER — SODIUM CHLORIDE 0.9 % IV BOLUS (SEPSIS)
1000.0000 mL | Freq: Once | INTRAVENOUS | Status: AC
  Administered 2017-10-26: 1000 mL via INTRAVENOUS

## 2017-10-26 MED ORDER — METOCLOPRAMIDE HCL 5 MG/ML IJ SOLN
10.0000 mg | Freq: Once | INTRAMUSCULAR | Status: AC
  Administered 2017-10-26: 10 mg via INTRAVENOUS
  Filled 2017-10-26: qty 2

## 2017-10-26 MED ORDER — DIPHENHYDRAMINE HCL 50 MG/ML IJ SOLN
25.0000 mg | Freq: Once | INTRAMUSCULAR | Status: AC
  Administered 2017-10-26: 25 mg via INTRAVENOUS
  Filled 2017-10-26: qty 1

## 2017-10-26 MED ORDER — DEXAMETHASONE SODIUM PHOSPHATE 10 MG/ML IJ SOLN
10.0000 mg | Freq: Once | INTRAMUSCULAR | Status: AC
  Administered 2017-10-26: 10 mg via INTRAVENOUS
  Filled 2017-10-26: qty 1

## 2017-10-26 MED ORDER — IBUPROFEN 600 MG PO TABS
600.0000 mg | ORAL_TABLET | Freq: Once | ORAL | Status: AC
  Administered 2017-10-26: 600 mg via ORAL
  Filled 2017-10-26: qty 1

## 2017-10-26 MED ORDER — KETOROLAC TROMETHAMINE 30 MG/ML IJ SOLN
30.0000 mg | Freq: Once | INTRAMUSCULAR | Status: AC
  Administered 2017-10-26: 30 mg via INTRAVENOUS
  Filled 2017-10-26: qty 1

## 2017-10-26 NOTE — Discharge Instructions (Addendum)
You are evaluated for headache which seems consistent with prior migraines.  Although no certain cause was found, your exam and evaluation are overall including return or worsening headache, any confusion or altered mental status, dizziness or passing out, vision changes, weakness, numbness, or any other symptoms concerning to you.  You are also evaluated for diarrhea, and although no certain cause was found, your exam evaluation are overall reassuring from that perspective as well.  Please return to emerge department if you have any worsening diarrhea, concern for dehydration, fever, black or bloody stools, dizziness or passing out, or any other symptoms concerning to you.  We also discussed that your urine sample could possibly be consistent with urinary tract infection, but without obvious symptoms, this is just being sent for culture and you would be called if it is positive.  We discussed that your heart rate was a little bit elevated here, but it sounds like you have had an elevated heart rate before and are off your anxiety medications, and declined blood cultures at this point in time.  Definitely make sure you return to the emergency department if you are having any fevers or feeling worse.

## 2017-10-26 NOTE — ED Triage Notes (Signed)
Pt states migraine x 2 days. Pt also c/o diarrhea x 2 days. Pt states that diarrhea is "like acid" and that she is unable to keep anything down. Pt also c/o generalized body aches at this time.

## 2017-10-26 NOTE — ED Notes (Signed)
FIRST NURSE NOTE:  Pt states she has had a migraine for the past 3 days, states her whole body hurts. Pt placed in wheelchair.

## 2017-10-26 NOTE — ED Provider Notes (Signed)
[facility] Emergency Department Provider Note ____________________________________________   I have reviewed the triage vital signs and the triage nursing note.  HISTORY  Chief Complaint Migraine and Diarrhea   Historian Patient  HPI Morgan Dean is a 42 y.o. female with history of migraines, states migraine headache started about 2 or 3 days ago, gradual onset and gradually worsening.  Global in nature.  No vision changes.  She has had nausea with some nonbloody nonbilious emesis.  She is also had a couple of watery diarrheas.  Nonbloody diarrhea.  No fevers.  She feels like her muscles are all cramped up.  Pain is moderate to severe.  She is sensitive to light.  Last migraine was sounds like quite some time ago, possibly years.  She is not currently on medications for migraines.   Past Medical History:  Diagnosis Date  . Kidney stones   . Migraine   . Renal abscess, right     Patient Active Problem List   Diagnosis Date Noted  . Sepsis (HCC) 05/27/2015  . Pyelonephritis 05/27/2015  . Renal abscess 05/27/2015  . Ureteral stone with hydronephrosis 05/17/2015    Past Surgical History:  Procedure Laterality Date  . APPENDECTOMY    . CESAREAN SECTION    . CYSTOSCOPY WITH RETROGRADE PYELOGRAM, URETEROSCOPY AND STENT PLACEMENT Right 05/17/2015   Procedure: CYSTOSCOPY WITH RETROGRADE PYELOGRAM, URETEROSCOPY AND STENT PLACEMENT;  Surgeon: Jerilee FieldMatthew Eskridge, MD;  Location: [facility] ORS;  Service: Urology;  Laterality: Right;  . RENAL ARTERY STENT    . TUBAL LIGATION    . URETEROSCOPY WITH HOLMIUM LASER LITHOTRIPSY  05/17/2015   Procedure: URETEROSCOPY WITH HOLMIUM LASER LITHOTRIPSY;  Surgeon: Jerilee FieldMatthew Eskridge, MD;  Location: [facility] ORS;  Service: Urology;;    Prior to Admission medications   Medication Sig Start Date End Date Taking? Authorizing Provider  acetaminophen (TYLENOL) 500 MG tablet Take 500-1,000 mg by mouth every 6 (six) hours as needed.   Yes  [provider]  docusate sodium (COLACE) 100 MG capsule Take 1 capsule (100 mg total) by mouth 2 (two) times daily. 05/29/15  Yes Mody, Patricia PesaSital, MD  ibuprofen (ADVIL,MOTRIN) 200 MG tablet Take 200-400 mg by mouth every 6 (six) hours as needed.   Yes [provider]  diazepam (VALIUM) 5 MG tablet Take 1 tablet (5 mg total) by mouth every 8 (eight) hours as needed for muscle spasms. Patient not taking: Reported on 10/26/2017 06/05/15   Emily FilbertWilliams, Jonathan E, MD  HYDROcodone-acetaminophen (NORCO/VICODIN) 5-325 MG tablet Take 1 tablet by mouth every 4 (four) hours as needed for moderate pain. Patient not taking: Reported on 10/26/2017 09/27/17 09/27/18  Chinita Pesterriplett, Cari B, FNP  ibuprofen (ADVIL,MOTRIN) 600 MG tablet Take 1 tablet (600 mg total) by mouth every 6 (six) hours as needed. Patient not taking: Reported on 10/26/2017 09/27/17   Kem Boroughsriplett, Cari B, FNP  LORazepam (ATIVAN) 1 MG tablet Take 1 tablet (1 mg total) by mouth 2 (two) times daily as needed for anxiety. Patient not taking: Reported on 10/26/2017 02/15/16   Jennye MoccasinQuigley, Brian S, MD  metoCLOPramide (REGLAN) 10 MG tablet Take 1 tablet (10 mg total) by mouth every 8 (eight) hours as needed (migraine). 02/15/16 02/14/17  Jennye MoccasinQuigley, Brian S, MD  nicotine (NICODERM CQ - DOSED IN MG/24 HOURS) 21 mg/24hr patch Place 1 patch (21 mg total) onto the skin daily. Patient not taking: Reported on 06/02/2015 05/29/15   Adrian SaranMody, Sital, MD  ondansetron (ZOFRAN ODT) 4 MG disintegrating tablet Take 1 tablet (4 mg total)  by mouth every 8 (eight) hours as needed for nausea or vomiting. Patient not taking: Reported on 10/26/2017 12/31/15   Gayla Doss, MD  tamsulosin (FLOMAX) 0.4 MG CAPS capsule Take 1 capsule (0.4 mg total) by mouth daily. Patient not taking: Reported on 10/26/2017 12/31/15   Gayla Doss, MD    Allergies  Allergen Reactions  . Tramadol Nausea And Vomiting    Family History  Problem Relation Age of Onset  . CAD Unknown   . Hypertension  Unknown   . Kidney failure Neg Hx   . Hematuria Neg Hx   . Sickle cell trait Neg Hx     Social History Social History   Tobacco Use  . Smoking status: Current Every Day Smoker    Packs/day: 0.50    Types: Cigarettes  . Smokeless tobacco: Never Used  Substance Use Topics  . Alcohol use: No  . Drug use: No    Review of Systems  Constitutional: Negative for fever. Eyes: Negative for visual changes. ENT: Negative for sore throat. Cardiovascular: Negative for chest pain. Respiratory: Negative for shortness of breath. Gastrointestinal: Positive for some vomiting and diarrhea. Genitourinary: Negative for dysuria. Musculoskeletal: Negative for back pain. Skin: Negative for rash. Neurological: Positive for global generalized headache as per HPI.  ____________________________________________   PHYSICAL EXAM:  VITAL SIGNS: ED Triage Vitals  Enc Vitals Group     BP 10/26/17 0757 (!) 156/100     Pulse Rate 10/26/17 0757 (!) 117     Resp 10/26/17 0757 20     Temp 10/26/17 0757 98.9 F (37.2 C)     Temp Source 10/26/17 0757 Oral     SpO2 10/26/17 0757 98 %     Weight 10/26/17 0757 145 lb (65.8 kg)     Height 10/26/17 0757 5\' 7"  (1.702 m)     Head Circumference --      Peak Flow --      Pain Score 10/26/17 0756 10     Pain Loc --      Pain Edu? --      Excl. in GC? --      Constitutional: Alert and oriented.  Has the lights down and is curled up in a fetal position tearful. HEENT   Head: Normocephalic and atraumatic.      Eyes: Conjunctivae are normal. Pupils equal and round.       Ears:         Nose: No congestion/rhinnorhea.   Mouth/Throat: Mucous membranes are moist.   Neck: No stridor.  No neck stiffness. Cardiovascular/Chest: Normal rate, regular rhythm.  No murmurs, rubs, or gallops. Respiratory: Normal respiratory effort without tachypnea nor retractions. Breath sounds are clear and equal bilaterally. No wheezes/rales/rhonchi. Gastrointestinal:  Soft. No distention, no guarding, no rebound. Nontender.    Genitourinary/rectal:Deferred Musculoskeletal: Nontender with normal range of motion in all extremities. No joint effusions.  No lower extremity tenderness.  No edema. Neurologic: No facial droop.  Normal speech and language. No gross or focal neurologic deficits are appreciated. Skin:  Skin is warm, dry and intact. No rash noted. Psychiatric: Mood and affect are normal. Speech and behavior are normal. Patient exhibits appropriate insight and judgment.   ____________________________________________  LABS (pertinent positives/negatives) I, Governor Rooks, MD the attending physician have reviewed the labs noted below.  Labs Reviewed  COMPREHENSIVE METABOLIC PANEL - Abnormal; Notable for the following components:      Result Value   CO2 18 (*)    Total Protein 8.6 (*)  All other components within normal limits  CBC WITH DIFFERENTIAL/PLATELET - Abnormal; Notable for the following components:   WBC 12.6 (*)    MCV 78.6 (*)    MCH 25.2 (*)    RDW 17.0 (*)    Neutro Abs 10.6 (*)    Basophils Absolute 0.2 (*)    All other components within normal limits  URINALYSIS, COMPLETE (UACMP) WITH MICROSCOPIC - Abnormal; Notable for the following components:   Color, Urine YELLOW (*)    APPearance CLOUDY (*)    Ketones, ur 20 (*)    Protein, ur 30 (*)    Leukocytes, UA MODERATE (*)    Bacteria, UA RARE (*)    Squamous Epithelial / LPF 6-30 (*)    All other components within normal limits  URINE CULTURE  POC URINE PREG, ED  POCT PREGNANCY, URINE    ____________________________________________    EKG I, Governor Rooks, MD, the attending physician have personally viewed and interpreted all ECGs.  None ____________________________________________  RADIOLOGY All Xrays were viewed by me.  Imaging interpreted by Radiologist, and I, Governor Rooks, MD the attending physician have reviewed the radiologist interpretation noted  below.  None __________________________________________  PROCEDURES  Procedure(s) performed: None  Critical Care performed: None   ____________________________________________  ED COURSE / ASSESSMENT AND PLAN  Pertinent labs & imaging results that were available during my care of the patient were reviewed by me and considered in my medical decision making (see chart for details).    Patient reporting headache that feels similar nature to prior migraines although it has been a while.  No high risk red flag features in terms of neurologic complaints or sudden or thunderclap onset.  She is also complaining of diarrhea and may be dehydrated.  Patient was given medications symptomatic for headache, and on reexamination after completing the medications and fluids, patient states her headache is nearly resolved now.  Her heart rate is still about 106, and I am getting give her second liter of fluid, possible volume depletion secondary to the diarrhea.  She has not had any additional diarrhea here.  I am going to go ahead and give her a dose of dexamethasone in hopes that may help prevent any recurrent or rebound migraine headache.   Patient feels much better and is ready to go home.  We discussed her urinalysis which could be possible contamination versus UTI, she is not really having specific urinary tract infection symptoms, we discussed whether or not to treat upfront versus wait for culture.  We chose to wait for culture, patient is comfortable with this plan.  Her heart rate still around 105, patient states that she has been told she has an elevated heart rate in the past and, previously was on anxiety medicine, is in off that now.  I do not think this is indicating sepsis or worsening condition or persistent hypovolemic state.  I did talk to her about the possibility of doing blood cultures, she does not want to do that.  We discussed return precautions the patient is  reasonable for follow-up.   DIFFERENTIAL DIAGNOSIS: Differential diagnosis includes, but is not limited to, intracranial hemorrhage, meningitis/encephalitis, previous head trauma, cavernous venous thrombosis, tension headache, temporal arteritis, migraine or migraine equivalent, idiopathic intracranial hypertension, and non-specific headache.  CONSULTATIONS:  None  Patient / Family / Caregiver informed of clinical course, medical decision-making process, and agree with plan.   I discussed return precautions, follow-up instructions, and discharge instructions with patient and/or family.  Discharge  Instructions : You are evaluated for headache which seems consistent with prior migraines.  Although no certain cause was found, your exam and evaluation are overall including return or worsening headache, any confusion or altered mental status, dizziness or passing out, vision changes, weakness, numbness, or any other symptoms concerning to you.  You are also evaluated for diarrhea, and although no certain cause was found, your exam evaluation are overall reassuring from that perspective as well.  Please return to emerge department if you have any worsening diarrhea, concern for dehydration, fever, black or bloody stools, dizziness or passing out, or any other symptoms concerning to you.    ___________________________________________   FINAL CLINICAL IMPRESSION(S) / ED DIAGNOSES   Final diagnoses:  Generalized headache  Diarrhea, unspecified type      ___________________________________________        Note: This dictation was prepared with Dragon dictation. Any transcriptional errors that result from this process are unintentional    Governor Rooks, MD 10/26/17 1250

## 2017-10-28 LAB — URINE CULTURE: Culture: NO GROWTH

## 2019-03-24 ENCOUNTER — Emergency Department: Payer: Self-pay

## 2019-03-24 ENCOUNTER — Emergency Department
Admission: EM | Admit: 2019-03-24 | Discharge: 2019-03-24 | Disposition: A | Discharge status: Home / Self Care | Payer: Self-pay | Attending: Emergency Medicine | Admitting: Emergency Medicine

## 2019-03-24 ENCOUNTER — Other Ambulatory Visit: Payer: Self-pay

## 2019-03-24 DIAGNOSIS — Y9389 Activity, other specified: Secondary | ICD-10-CM | POA: Insufficient documentation

## 2019-03-24 DIAGNOSIS — F17210 Nicotine dependence, cigarettes, uncomplicated: Secondary | ICD-10-CM | POA: Insufficient documentation

## 2019-03-24 DIAGNOSIS — S060X1A Concussion with loss of consciousness of 30 minutes or less, initial encounter: Secondary | ICD-10-CM

## 2019-03-24 DIAGNOSIS — H539 Unspecified visual disturbance: Secondary | ICD-10-CM | POA: Insufficient documentation

## 2019-03-24 DIAGNOSIS — S0990XA Unspecified injury of head, initial encounter: Secondary | ICD-10-CM

## 2019-03-24 DIAGNOSIS — R42 Dizziness and giddiness: Secondary | ICD-10-CM | POA: Insufficient documentation

## 2019-03-24 DIAGNOSIS — Y998 Other external cause status: Secondary | ICD-10-CM | POA: Insufficient documentation

## 2019-03-24 DIAGNOSIS — Y92012 Bathroom of single-family (private) house as the place of occurrence of the external cause: Secondary | ICD-10-CM | POA: Insufficient documentation

## 2019-03-24 DIAGNOSIS — M25512 Pain in left shoulder: Secondary | ICD-10-CM | POA: Insufficient documentation

## 2019-03-24 DIAGNOSIS — R41840 Attention and concentration deficit: Secondary | ICD-10-CM | POA: Insufficient documentation

## 2019-03-24 DIAGNOSIS — W19XXXA Unspecified fall, initial encounter: Secondary | ICD-10-CM

## 2019-03-24 DIAGNOSIS — W01198A Fall on same level from slipping, tripping and stumbling with subsequent striking against other object, initial encounter: Secondary | ICD-10-CM | POA: Insufficient documentation

## 2019-03-24 DIAGNOSIS — R110 Nausea: Secondary | ICD-10-CM | POA: Insufficient documentation

## 2019-03-24 MED ORDER — METHOCARBAMOL 500 MG PO TABS: 500.0000 mg | ORAL_TABLET | Freq: Four times a day (QID) | ORAL | 0 refills | Status: DC

## 2019-03-24 MED ORDER — PROMETHAZINE HCL 25 MG/ML IJ SOLN
25.0000 mg | Freq: Once | INTRAMUSCULAR | Status: AC
  Administered 2019-03-24: 25 mg via INTRAMUSCULAR
  Filled 2019-03-24: qty 1

## 2019-03-24 MED ORDER — ONDANSETRON 4 MG PO TBDP: 4.0000 mg | ORAL_TABLET | Freq: Three times a day (TID) | ORAL | 0 refills | Status: DC | PRN

## 2019-03-24 MED ORDER — DIPHENHYDRAMINE HCL 50 MG/ML IJ SOLN
50.0000 mg | Freq: Once | INTRAMUSCULAR | Status: AC
  Administered 2019-03-24: 50 mg via INTRAMUSCULAR
  Filled 2019-03-24: qty 1

## 2019-03-24 MED ORDER — KETOROLAC TROMETHAMINE 30 MG/ML IJ SOLN
30.0000 mg | Freq: Once | INTRAMUSCULAR | Status: AC
  Administered 2019-03-24: 30 mg via INTRAMUSCULAR
  Filled 2019-03-24: qty 1

## 2019-03-24 MED ORDER — BUTALBITAL-APAP-CAFFEINE 50-325-40 MG PO TABS: 1.0000 | ORAL_TABLET | Freq: Four times a day (QID) | ORAL | 0 refills | Status: AC | PRN

## 2019-03-24 MED ORDER — HYDROCODONE-ACETAMINOPHEN 5-325 MG PO TABS: 1.0000 | ORAL_TABLET | ORAL | 0 refills | Status: DC | PRN

## 2019-03-24 MED ORDER — OXYCODONE-ACETAMINOPHEN 5-325 MG PO TABS
1.0000 | ORAL_TABLET | Freq: Once | ORAL | Status: AC
  Administered 2019-03-24: 1 via ORAL
  Filled 2019-03-24: qty 1

## 2019-03-24 MED ORDER — MELOXICAM 15 MG PO TABS: 15.0000 mg | ORAL_TABLET | Freq: Every day | ORAL | 0 refills | Status: DC

## 2019-03-24 NOTE — ED Provider Notes (Signed)
[facility] Emergency Department Provider Note  ____________________________________________  Time seen: Approximately 6:15 PM  I have reviewed the triage vital signs and the nursing notes.   HISTORY  Chief Complaint Head Injury    HPI Morgan Dean is a 43 y.o. female who presents the emergency department for evaluation of head injury.  Patient reports that 2 days ago she fell after tripping.  Patient was in the bathroom, fell forward striking her head and face against the tub.  Patient did lose consciousness for 2 to 3 minutes.  Initially, patient reports that other than pain to the skull, she had no other symptoms of headache, neck pain, dizziness, visual changes.  Since then, patient has had ongoing left-sided headaches that she describes as a pressure sensation.  Patient is having some difficulty concentrating, visual changes to the left eye.  She feels dizzy and nauseated.  No history of head trauma.  Patient does have a history of migraines but states that the symptoms are not consistent with her migraine.  Patient has been taking Motrin since the injury with no relief.  Patient denies any emesis.  She denies any radicular symptoms in the upper or lower extremities.  She does endorse left-sided shoulder pain as well.  She still has good range of motion to the left shoulder.         Past Medical History:  Diagnosis Date  . Kidney stones   . Migraine   . Renal abscess, right     Patient Active Problem List   Diagnosis Date Noted  . Sepsis (HCC) 05/27/2015  . Pyelonephritis 05/27/2015  . Renal abscess 05/27/2015  . Ureteral stone with hydronephrosis 05/17/2015    Past Surgical History:  Procedure Laterality Date  . APPENDECTOMY    . CESAREAN SECTION    . CYSTOSCOPY WITH RETROGRADE PYELOGRAM, URETEROSCOPY AND STENT PLACEMENT Right 05/17/2015   Procedure: CYSTOSCOPY WITH RETROGRADE PYELOGRAM, URETEROSCOPY AND STENT PLACEMENT;  Surgeon: Jerilee FieldMatthew  Eskridge, MD;  Location: [facility] ORS;  Service: Urology;  Laterality: Right;  . RENAL ARTERY STENT    . TUBAL LIGATION    . URETEROSCOPY WITH HOLMIUM LASER LITHOTRIPSY  05/17/2015   Procedure: URETEROSCOPY WITH HOLMIUM LASER LITHOTRIPSY;  Surgeon: Jerilee FieldMatthew Eskridge, MD;  Location: [facility] ORS;  Service: Urology;;    Prior to Admission medications   Medication Sig Start Date End Date Taking? Authorizing Provider  acetaminophen (TYLENOL) 500 MG tablet Take 500-1,000 mg by mouth every 6 (six) hours as needed.    [provider]  butalbital-acetaminophen-caffeine (FIORICET) [phone] MG tablet Take 1-2 tablets by mouth every 6 (six) hours as needed for headache. 03/24/19 03/23/20  Cuthriell, Delorise RoyalsJonathan D, PA-C  HYDROcodone-acetaminophen (NORCO/VICODIN) 5-325 MG tablet Take 1 tablet by mouth every 4 (four) hours as needed for moderate pain. 03/24/19   Cuthriell, Delorise RoyalsJonathan D, PA-C  meloxicam (MOBIC) 15 MG tablet Take 1 tablet (15 mg total) by mouth daily. 03/24/19   Cuthriell, Delorise RoyalsJonathan D, PA-C  methocarbamol (ROBAXIN) 500 MG tablet Take 1 tablet (500 mg total) by mouth 4 (four) times daily. 03/24/19   Cuthriell, Delorise RoyalsJonathan D, PA-C  ondansetron (ZOFRAN-ODT) 4 MG disintegrating tablet Take 1 tablet (4 mg total) by mouth every 8 (eight) hours as needed for nausea or vomiting. 03/24/19   Cuthriell, Delorise RoyalsJonathan D, PA-C    Allergies Tramadol and Flexeril [cyclobenzaprine]  Family History  Problem Relation Age of Onset  . CAD Other   . Hypertension Other   . Kidney failure Neg Hx   .  Hematuria Neg Hx   . Sickle cell trait Neg Hx     Social History Social History   Tobacco Use  . Smoking status: Current Every Day Smoker    Packs/day: 0.50    Types: Cigarettes  . Smokeless tobacco: Never Used  Substance Use Topics  . Alcohol use: No  . Drug use: No     Review of Systems  Constitutional: No fever/chills Eyes: No visual changes. No discharge ENT: No upper respiratory complaints. Cardiovascular: no  chest pain. Respiratory: no cough. No SOB. Gastrointestinal: No abdominal pain.  Positive nausea, denies vomiting.  No diarrhea.  No constipation. Musculoskeletal: Positive for left shoulder pain and neck pain. Skin: Negative for rash, abrasions, lacerations, ecchymosis. Neurological: Positive for headaches and dizziness.  Denies focal weakness or numbness. 10-point ROS otherwise negative.  ____________________________________________   PHYSICAL EXAM:  VITAL SIGNS: ED Triage Vitals  Enc Vitals Group     BP 03/24/19 1416 (!) 157/96     Pulse Rate 03/24/19 1416 (!) 123     Resp 03/24/19 1416 16     Temp 03/24/19 1416 98.6 F (37 C)     Temp Source 03/24/19 1416 Oral     SpO2 03/24/19 1416 99 %     Weight 03/24/19 1412 150 lb (68 kg)     Height 03/24/19 1412 5\' 6"  (1.676 m)     Head Circumference --      Peak Flow --      Pain Score 03/24/19 1412 10     Pain Loc --      Pain Edu? --      Excl. in GC? --      Constitutional: Alert and oriented. Well appearing and in no acute distress. Eyes: Conjunctivae are normal. PERRL. EOMI. Head: Visualization of the face reveals significant ecchymosis and edema surrounding the left orbit.  Extraocular motions intact.  Patient is exquisitely tender to palpation along the left inferior orbit.  No crepitus.  No other gross signs of trauma to the skull.  Patient is very tender to palpation along the left frontal and temporal region where she struck her head.  No palpable abnormality or crepitus.  No other tenderness to palpation of the osseous structures of the skull and face.  Periorbital edema and ecchymosis is only on the left orbit.  This does not have any right side involvement.  No battle signs.  No serosanguineous fluid drainage from the ears or nares. ENT:      Ears:       Nose: No congestion/rhinnorhea.      Mouth/Throat: Mucous membranes are moist.  Neck: No stridor.  No midline cervical spine tenderness to palpation.  Diffuse  tenderness to palpation along the left trapezius muscle extending into the left posterior shoulder.  Good range of motion to bilateral shoulders.  Radial pulse intact bilateral upper extremities.  Sensation intact and equal bilateral upper extremities.  Cardiovascular: Normal rate, regular rhythm. Normal S1 and S2.  Good peripheral circulation. Respiratory: Normal respiratory effort without tachypnea or retractions. Lungs CTAB. Good air entry to the bases with no decreased or absent breath sounds. Musculoskeletal: Full range of motion to all extremities. No gross deformities appreciated. Neurologic:  Normal speech and language. No gross focal neurologic deficits are appreciated.  Cranial nerves II through XII grossly intact. Skin:  Skin is warm, dry and intact. No rash noted. Psychiatric: Mood and affect are normal. Speech and behavior are normal. Patient exhibits appropriate insight and judgement.  ____________________________________________   LABS (all labs ordered are listed, but only abnormal results are displayed)  Labs Reviewed - No data to display ____________________________________________  EKG   ____________________________________________  RADIOLOGY I personally viewed and evaluated these images as part of my medical decision making, as well as reviewing the written report by the radiologist.  Ct Head Wo Contrast  Result Date: 03/24/2019 CLINICAL DATA:  Head trauma with ataxia headache with nausea and dizziness EXAM: CT HEAD WITHOUT CONTRAST TECHNIQUE: Contiguous axial images were obtained from the base of the skull through the vertex without intravenous contrast. COMPARISON:  CT 04/24/2015 FINDINGS: Brain: No acute territorial infarction, hemorrhage or intracranial mass. The ventricles are nonenlarged. Vascular: No hyperdense vessel or unexpected calcification. Skull: Normal. Negative for fracture or focal lesion. Sinuses/Orbits: No acute finding. Other: Small left anterior  scalp swelling IMPRESSION: Negative non contrasted CT appearance of the brain Electronically Signed   By: Donavan Foil M.D.   On: 03/24/2019 15:20   Dg Shoulder Left  Result Date: 03/24/2019 CLINICAL DATA:  Left foot shoulder pain status post fall EXAM: LEFT SHOULDER - 2+ VIEW COMPARISON:  None. FINDINGS: There is no evidence of fracture or dislocation. There is no evidence of arthropathy or other focal bone abnormality. Soft tissues are unremarkable. IMPRESSION: Negative. Electronically Signed   By: Constance Holster M.D.   On: 03/24/2019 15:11   Ct Maxillofacial Wo Contrast  Result Date: 03/24/2019 CLINICAL DATA:  Status post fall, hit the left side of the head. EXAM: CT MAXILLOFACIAL WITHOUT CONTRAST TECHNIQUE: Multidetector CT imaging of the maxillofacial structures was performed. Multiplanar CT image reconstructions were also generated. COMPARISON:  None. FINDINGS: Osseous: No fracture or mandibular dislocation. No destructive process. Orbits: Negative. No traumatic or inflammatory finding. Sinuses: Right maxillary sinus mucosal thickening. Soft tissues: Left periorbital soft tissue swelling. Limited intracranial: No significant or unexpected finding. IMPRESSION: 1.  No acute osseous injury of the maxillofacial bones. Electronically Signed   By: Kathreen Devoid   On: 03/24/2019 18:57    ____________________________________________    PROCEDURES  Procedure(s) performed:    Procedures    Medications  ketorolac (TORADOL) 30 MG/ML injection 30 mg (has no administration in time range)  promethazine (PHENERGAN) injection 25 mg (has no administration in time range)  diphenhydrAMINE (BENADRYL) injection 50 mg (has no administration in time range)  oxyCODONE-acetaminophen (PERCOCET/ROXICET) 5-325 MG per tablet 1 tablet (1 tablet Oral Given 03/24/19 1832)     ____________________________________________   INITIAL IMPRESSION / ASSESSMENT AND PLAN / ED COURSE  Pertinent labs & imaging  results that were available during my care of the patient were reviewed by me and considered in my medical decision making (see chart for details).  Review of the  [place] CSRS was performed in accordance of the [place] prior to dispensing any controlled drugs.           Patient's diagnosis is consistent with fall, head injury. Patient wil, fall, head injury, concussion.  Patient presented to the emergency department after mechanical fall that occurred several days ago.  Patient has had ongoing headache, ecchymosis around the left eye.  Patient was evaluated with x-rays and CT scans.  These returned with reassuring results with no acute intracranial or osseous abnormality.  Given patient's symptoms, I do suspect that she has a concussion.  Patient will be given multiple medications for at home use for symptom relief.  I discussed medications thoroughly as well as compounding side effects of several other medications.  Patient verbalizes understanding of same.  Patient symptoms continue she will follow-up with neurology.  No indication for further work-up at this time. Patient is given ED precautions to return to the ED for any worsening or new symptoms.     ____________________________________________  FINAL CLINICAL IMPRESSION(S) / ED DIAGNOSES  Final diagnoses:  Fall, initial encounter  Injury of head, initial encounter  Concussion with loss of consciousness of 30 minutes or less, initial encounter      NEW MEDICATIONS STARTED DURING THIS VISIT:  ED Discharge Orders         Ordered    butalbital-acetaminophen-caffeine (FIORICET) 50-325-40 MG tablet  Every 6 hours PRN     03/24/19 1920    HYDROcodone-acetaminophen (NORCO/VICODIN) 5-325 MG tablet  Every 4 hours PRN     03/24/19 1920    ondansetron (ZOFRAN-ODT) 4 MG disintegrating tablet  Every 8 hours PRN     03/24/19 1920    methocarbamol (ROBAXIN) 500 MG tablet  4 times daily     03/24/19 1920    meloxicam (MOBIC) 15 MG tablet  Daily      03/24/19 1920              This chart was dictated using voice recognition software/Dragon. Despite best efforts to proofread, errors can occur which can change the meaning. Any change was purely unintentional.    Racheal PatchesCuthriell, Jonathan D, PA-C 03/24/19 1924    Shaune PollackIsaacs, Cameron, MD 03/24/19 2012

## 2019-03-24 NOTE — ED Triage Notes (Signed)
Pt states she slipped in the shower on Sunday and hit the left side of her head and is c/o HA with nausea and dizziness with changes in vision and left shoulder pain. Pt has bruising and swelling around the left eye

## 2019-03-24 NOTE — ED Notes (Signed)
See triage note  Presents s/p fall on Sunday  States she fell face first into tub on Sunday hitting her head  States she did have some LOC at that time    Since has been nauseated and dizzy  Having pain to left side of neck ,shoulder and head  But states she also is having pressure to right side of head

## 2021-02-16 ENCOUNTER — Emergency Department
Admission: EM | Admit: 2021-02-16 | Discharge: 2021-02-16 | Disposition: A | Discharge status: Home / Self Care | Payer: Self-pay | Attending: Emergency Medicine | Admitting: Emergency Medicine

## 2021-02-16 ENCOUNTER — Other Ambulatory Visit: Payer: Self-pay

## 2021-02-16 DIAGNOSIS — K0889 Other specified disorders of teeth and supporting structures: Secondary | ICD-10-CM | POA: Insufficient documentation

## 2021-02-16 DIAGNOSIS — F17210 Nicotine dependence, cigarettes, uncomplicated: Secondary | ICD-10-CM | POA: Insufficient documentation

## 2021-02-16 MED ORDER — ACETAMINOPHEN-CODEINE 300-30 MG PO TABS: 1.0000 | ORAL_TABLET | Freq: Four times a day (QID) | ORAL | 0 refills | Status: AC | PRN

## 2021-02-16 MED ORDER — AMOXICILLIN 875 MG PO TABS: 875.0000 mg | ORAL_TABLET | Freq: Two times a day (BID) | ORAL | 0 refills | Status: AC

## 2021-02-16 NOTE — ED Notes (Signed)
See triage note   Presents with dental pain   States she developed pain to right lower gum line 3 weeks ago

## 2021-02-16 NOTE — ED Provider Notes (Signed)
[facility] Emergency Department Provider Note  ____________________________________________   Event Date/Time   First MD Initiated Contact with Patient 02/16/21 [phone]     (approximate)  I have reviewed the triage vital signs and the nursing notes.   HISTORY  Chief Complaint Dental Pain   HPI Morgan Dean is a 45 y.o. female presents to the ED with complaint of dental pain for the last 3 weeks but worse in the last 24 hours.  Patient states that in the past she has taken ibuprofen and it is helped.  She states that she does not have a dentist.  Currently she rates her pain as 10 of 10.       Past Medical History:  Diagnosis Date  . Kidney stones   . Migraine   . Renal abscess, right     Patient Active Problem List   Diagnosis Date Noted  . Sepsis (HCC) 05/27/2015  . Pyelonephritis 05/27/2015  . Renal abscess 05/27/2015  . Ureteral stone with hydronephrosis 05/17/2015    Past Surgical History:  Procedure Laterality Date  . APPENDECTOMY    . CESAREAN SECTION    . CYSTOSCOPY WITH RETROGRADE PYELOGRAM, URETEROSCOPY AND STENT PLACEMENT Right 05/17/2015   Procedure: CYSTOSCOPY WITH RETROGRADE PYELOGRAM, URETEROSCOPY AND STENT PLACEMENT;  Surgeon: Jerilee Field, MD;  Location: [facility] ORS;  Service: Urology;  Laterality: Right;  . RENAL ARTERY STENT    . TUBAL LIGATION    . URETEROSCOPY WITH HOLMIUM LASER LITHOTRIPSY  05/17/2015   Procedure: URETEROSCOPY WITH HOLMIUM LASER LITHOTRIPSY;  Surgeon: Jerilee Field, MD;  Location: [facility] ORS;  Service: Urology;;    Prior to Admission medications   Medication Sig Start Date End Date Taking? Authorizing Provider  Acetaminophen-Codeine (TYLENOL/CODEINE #3) 300-30 MG tablet Take 1 tablet by mouth every 6 (six) hours as needed for pain. 02/16/21  Yes Tommi Rumps, PA-C  amoxicillin (AMOXIL) 875 MG tablet Take 1 tablet (875 mg total) by mouth 2 (two) times daily. 02/16/21  Yes Tommi Rumps, PA-C   acetaminophen (TYLENOL) 500 MG tablet Take 500-1,000 mg by mouth every 6 (six) hours as needed.    [provider]    Allergies Tramadol and Flexeril [cyclobenzaprine]  Family History  Problem Relation Age of Onset  . CAD Other   . Hypertension Other   . Kidney failure Neg Hx   . Hematuria Neg Hx   . Sickle cell trait Neg Hx     Social History Social History   Tobacco Use  . Smoking status: Current Every Day Smoker    Packs/day: 0.50    Types: Cigarettes  . Smokeless tobacco: Never Used  Vaping Use  . Vaping Use: Never used  Substance Use Topics  . Alcohol use: No  . Drug use: No    Review of Systems Constitutional: No fever/chills Eyes: No visual changes. ENT: Positive dental pain. Cardiovascular: Denies chest pain. Respiratory: Denies shortness of breath. Gastrointestinal: No abdominal pain.  No nausea, no vomiting.  No diarrhea.  Musculoskeletal: Negative for back pain. Skin: Negative for rash. Neurological: Negative for headaches, focal weakness or numbness.  ____________________________________________   PHYSICAL EXAM:  VITAL SIGNS: ED Triage Vitals  Enc Vitals Group     BP 02/16/21 0813 (!) 123/92     Pulse Rate 02/16/21 0813 (!) 104     Resp 02/16/21 0813 20     Temp 02/16/21 0813 98.6 F (37 C)     Temp Source 02/16/21 0813 Oral  SpO2 02/16/21 0813 99 %     Weight 02/16/21 0804 130 lb (59 kg)     Height 02/16/21 0804 5\' 6"  (1.676 m)     Head Circumference --      Peak Flow --      Pain Score 02/16/21 0804 10     Pain Loc --      Pain Edu? --      Excl. in GC? --     Constitutional: Alert and oriented. Well appearing and in no acute distress. Eyes: Conjunctivae are normal.  Head: Atraumatic. Nose: No congestion/rhinnorhea. Mouth/Throat: Mucous membranes are moist.  Oropharynx non-erythematous.  Right lower gums are edematous and tender.  No active drainage is noted. Neck: No stridor.   Cardiovascular: Normal rate, regular  rhythm. Grossly normal heart sounds.  Good peripheral circulation. Respiratory: Normal respiratory effort.  No retractions. Lungs CTAB. Gastrointestinal: Soft and nontender. No distention. No abdominal bruits. No CVA tenderness. Musculoskeletal: No lower extremity tenderness nor edema.  No joint effusions. Neurologic:  Normal speech and language. No gross focal neurologic deficits are appreciated. No gait instability. Skin:  Skin is warm, dry and intact. No rash noted. Psychiatric: Mood and affect are normal. Speech and behavior are normal.  ____________________________________________   LABS (all labs ordered are listed, but only abnormal results are displayed)  Labs Reviewed - No data to display ____________________________________________   PROCEDURES  Procedure(s) performed (including Critical Care):  Procedures   ____________________________________________   INITIAL IMPRESSION / ASSESSMENT AND PLAN / ED COURSE  As part of my medical decision making, I reviewed the following data within the electronic MEDICAL RECORD NUMBER Notes from prior ED visits and [place] Controlled Substance Database  45 year old female presents to the ED with complaint of dental pain for the last 3 weeks with worsening during the night.  Patient states she has been taking ibuprofen without any relief.  On exam gums are edematous and dental repair and hygiene is very poor.  Patient was prescribed amoxicillin 875 twice daily for 10 days a prescription for Tylenol with codeine was sent to the pharmacy as needed for pain today.  Patient was given list of dental clinics in the area that charge on a sliding scale and she was encouraged to call and make an appointment or also go to [facility]. ____________________________________________   FINAL CLINICAL IMPRESSION(S) / ED DIAGNOSES  Final diagnoses:  Pain, dental     ED Discharge Orders         Ordered    amoxicillin (AMOXIL) 875 MG tablet  2 times daily         02/16/21 0850    Acetaminophen-Codeine (TYLENOL/CODEINE #3) 300-30 MG tablet  Every 6 hours PRN        02/16/21 02/18/21          *Please note:  Morgan Dean was evaluated in Emergency Department on 02/16/2021 for the symptoms described in the history of present illness. She was evaluated in the context of the global COVID-19 pandemic, which necessitated consideration that the patient might be at risk for infection with the SARS-CoV-2 virus that causes COVID-19. Institutional protocols and algorithms that pertain to the evaluation of patients at risk for COVID-19 are in a state of rapid change based on information released by regulatory bodies including the CDC and federal and state organizations. These policies and algorithms were followed during the patient's care in the ED.  Some ED evaluations and interventions may be delayed as a result of limited staffing  during and the pandemic.*   Note:  This document was prepared using Dragon voice recognition software and may include unintentional dictation errors.    Tommi Rumps, PA-C 02/16/21 1234    Shaune Pollack, MD 02/17/21 [phone]

## 2021-02-16 NOTE — ED Triage Notes (Signed)
Pt c/o right lower tooth ache for the past 3 weeks. No facial swelling

## 2021-02-16 NOTE — Discharge Instructions (Addendum)
You will need to see a dentist about your dental pain.  A list of dental clinics is listed on your discharge papers.  Also start amoxicillin 875 twice daily until completely finished.  OPTIONS FOR DENTAL FOLLOW UP CARE  [place] [facility] - Local Safety Net Dental Clinics [email or web address]   [facility] [phone])  Morgan Dean [phone])  [place] [phone])  [facility] [phone])  [facility] [phone]) This clinic caters to the indigent population and is on a lottery system. Location: [employer] [facility], [employer], [address] [place] Clinic Hours: Wednesdays from 6pm - 9pm, patients seen by a lottery system. For dates, call or go to [email or web address] Services: Cleanings, fillings and simple extractions. Payment Options: DENTAL WORK IS FREE OF CHARGE. Bring proof of income or support. Best way to get seen: Arrive at 5:15 pm - this is a lottery, NOT first come/first serve, so arriving earlier will not increase your chances of being seen.     [facility] [phone] Select option 1 for emergencies   Location: [facility], [place], [address], [place] Clinic Hours: No walk-ins accepted - call the day before to schedule an appointment. Check in times are 9:30 am and 1:30 pm. Services: Simple extractions, temporary fillings, pulpectomy/pulp debridement, uncomplicated abscess drainage. Payment Options: PAYMENT IS DUE AT THE TIME OF SERVICE.  Fee is usually $100-200, additional surgical procedures (e.g. abscess drainage) may be extra. Cash, checks, Visa/MasterCard accepted.  Can file Medicaid if patient is covered for dental - patient should call case worker to check. No discount for [facility] patients. Best way  to get seen: MUST call the day before and get onto the schedule. Can usually be seen the next 1-2 days. No walk-ins accepted.     [facility] [phone]   Location: [facility], [address] [place] Clinic Hours: M, W, Th, F 8am or 1:30pm, Tues 9a or 1:30 - first come/first served. Services: Simple extractions, temporary fillings, uncomplicated abscess drainage.  You do not need to be an [facility] resident. Payment Options: PAYMENT IS DUE AT THE TIME OF SERVICE. Dental insurance, otherwise sliding scale - bring proof of income or support. Depending on income and treatment needed, cost is usually $50-200. Best way to get seen: Arrive early as it is first come/first served.     [facility] [phone]   Location: [address] Clinic Hours: Mon-Thu 8a-5p Services: Most basic dental services including extractions and fillings. Payment Options: PAYMENT IS DUE AT THE TIME OF SERVICE. Sliding scale, up to 50% off - bring proof if income or support. Medicaid with dental option accepted. Best way to get seen: Call to schedule an appointment, can usually be seen within 2 weeks OR they will try to see walk-ins - show up at 8a or 2p (you may have to wait).     [facility] [phone] [place] RESIDENTS ONLY   Location: [facility], [address] [place], [place] [address] Clinic Hours: By appointment only. Monday - Thursday 8am-5pm, Friday 8am-12pm Services: Cleanings, fillings, extractions. Payment Options: PAYMENT IS DUE AT THE TIME OF SERVICE. Cash, Visa or MasterCard. Sliding scale - $30 minimum per service. Best way to get seen: Come in to office, complete packet and make an appointment - need proof of income or support monies  for each household member and proof of [facility] residence. Usually takes about a month to get in.      [facility] [phone]   Location: [address], [facility] Clinic Hours: Walk-in Urgent Care Dental Services are offered Monday-Friday mornings only. The numbers of emergencies accepted daily is limited to the number of providers available. Maximum 15 - Mondays, Wednesdays & Thursdays Maximum 10 - Tuesdays & Fridays Services: You do not need to be a [facility] resident to be seen for a dental emergency. Emergencies are defined as pain, swelling, abnormal bleeding, or dental trauma. Walkins will receive x-rays if needed. NOTE: Dental cleaning is not an emergency. Payment Options: PAYMENT IS DUE AT THE TIME OF SERVICE. Minimum co-pay is $40.00 for uninsured patients. Minimum co-pay is $3.00 for Medicaid with dental coverage. Dental Insurance is accepted and must be presented at time of visit. Medicare does not cover dental. Forms of payment: Cash, credit card, checks. Best way to get seen: If not previously registered with the clinic, walk-in dental registration begins at 7:15 am and is on a first come/first serve basis. If previously registered with the clinic, call to make an appointment.     [facility] [phone] [place] RESIDENTS ONLY   Location: [address] [place], [place] Clinic Hours: Mon-Thu 10a-2p Services: Extractions only! Payment Options: FREE (donations accepted) - bring proof of income or support Best way to get seen: Call and schedule an appointment OR come at 8am on the 1st Monday of every month (except for holidays) when it is first come/first served.     [facility] [phone]   Location: [address] [place], [place] Clinic Hours: Friday mornings Services, Payment Options, Best way to get seen: Call for info

## 2021-08-07 ENCOUNTER — Emergency Department
Admission: EM | Admit: 2021-08-07 | Discharge: 2021-08-07 | Disposition: A | Discharge status: Home / Self Care | Payer: Self-pay
